# Patient Record
Sex: Male | Born: 1965
Health system: Southern US, Community
[De-identification: ages and names within clinical notes are randomized; demographics above are authoritative.]

## PROBLEM LIST (undated history)

## (undated) DIAGNOSIS — M1990 Unspecified osteoarthritis, unspecified site: Secondary | ICD-10-CM

## (undated) DIAGNOSIS — K5792 Diverticulitis of intestine, part unspecified, without perforation or abscess without bleeding: Secondary | ICD-10-CM

## (undated) DIAGNOSIS — J40 Bronchitis, not specified as acute or chronic: Secondary | ICD-10-CM

## (undated) DIAGNOSIS — K219 Gastro-esophageal reflux disease without esophagitis: Secondary | ICD-10-CM

## (undated) DIAGNOSIS — J189 Pneumonia, unspecified organism: Secondary | ICD-10-CM

## (undated) HISTORY — PX: APPENDECTOMY: SHX54

## (undated) HISTORY — DX: Unspecified osteoarthritis, unspecified site: M19.90

## (undated) HISTORY — PX: HERNIA REPAIR: SHX51

## (undated) HISTORY — DX: Gastro-esophageal reflux disease without esophagitis: K21.9

## (undated) HISTORY — DX: Diverticulitis of intestine, part unspecified, without perforation or abscess without bleeding: K57.92

## (undated) HISTORY — PX: POLYPECTOMY: SHX149

## (undated) HISTORY — PX: COLONOSCOPY: SHX174

---

## 2000-07-12 ENCOUNTER — Encounter (INDEPENDENT_AMBULATORY_CARE_PROVIDER_SITE_OTHER): Payer: Self-pay | Admitting: Specialist

## 2000-07-12 ENCOUNTER — Inpatient Hospital Stay (HOSPITAL_COMMUNITY): Admission: EM | Admit: 2000-07-12 | Discharge: 2000-07-14 | Payer: Self-pay | Admitting: *Deleted

## 2000-07-12 ENCOUNTER — Encounter: Payer: Self-pay | Admitting: Surgery

## 2000-07-25 ENCOUNTER — Emergency Department (HOSPITAL_COMMUNITY): Admission: EM | Admit: 2000-07-25 | Discharge: 2000-07-25 | Payer: Self-pay | Admitting: Internal Medicine

## 2007-02-02 ENCOUNTER — Emergency Department (HOSPITAL_COMMUNITY): Admission: EM | Admit: 2007-02-02 | Discharge: 2007-02-02 | Payer: Self-pay | Admitting: Emergency Medicine

## 2007-04-17 ENCOUNTER — Emergency Department (HOSPITAL_COMMUNITY): Admission: EM | Admit: 2007-04-17 | Discharge: 2007-04-17 | Payer: Self-pay | Admitting: Family Medicine

## 2007-05-27 ENCOUNTER — Emergency Department (HOSPITAL_COMMUNITY): Admission: EM | Admit: 2007-05-27 | Discharge: 2007-05-27 | Payer: Self-pay | Admitting: Family Medicine

## 2009-09-07 ENCOUNTER — Emergency Department (HOSPITAL_COMMUNITY): Admission: EM | Admit: 2009-09-07 | Discharge: 2009-09-07 | Payer: Self-pay | Admitting: Family Medicine

## 2010-07-04 ENCOUNTER — Emergency Department (HOSPITAL_COMMUNITY): Admission: EM | Admit: 2010-07-04 | Discharge: 2010-07-04 | Payer: Self-pay | Admitting: Emergency Medicine

## 2010-07-24 ENCOUNTER — Emergency Department (HOSPITAL_COMMUNITY)
Admission: EM | Admit: 2010-07-24 | Discharge: 2010-07-24 | Payer: Self-pay | Source: Home / Self Care | Admitting: Family Medicine

## 2010-12-04 LAB — CULTURE, ROUTINE-ABSCESS

## 2011-02-08 NOTE — Op Note (Signed)
[place]. [facility]  Patient:    Jon Vasquez, Jon Vasquez                      MRN: [id number] Proc. Date: 07/12/00 Adm. Date:  [id number] Attending:  Bonnetta Barry CC:         Virgina Norfolk, M.D.- [place], [facility]  Rene Kocher, M.D. [facility] EMERGENCY DEPARTMENT IN [facility] [place]   Operative Report  PREOPERATIVE  DIAGNOSIS:  Acute appendicitis.  POSTOPERATIVE DIAGNOSIS:  Acute appendicitis.  PROCEDURE:  Laparoscopic appendectomy.  SURGEON:  Velora Heckler, M.D.  ANESTHESIA:  General per Dr. Randa Evens.  ESTIMATED BLOOD LOSS:  Minimal.  PREPARATION:  Betadine.  COMPLICATIONS:  None.  INDICATIONS:  The patient is a 45 year old white male presents to the emergency department on referral from [facility] with right lower quadrant abdominal pain and fever.  The patient was evaluated by general surgery and CT scan with rectal contrast demonstrates findings consistent with acute appendicitis.  The patient was brought to the operating room for a laparoscopic appendectomy.  PROCEDURE:  The procedure was done in OR #15 at the [facility].  The patient was brought to the operating room and placed in a supine position on the operating room table.  Following administration of general anesthesia the patient was prepped and draped in the usual strict aseptic fashion.  After ascertaining an adequate level of anesthesia had been obtained, a supraumbilical incision was made with a #15 blade.  Dissection was carried down to the fascia.  The fascia was incised in the midline and the peritoneal cavity was entered cautiously.  An 0 Vicryl pursestring suture was placed in the fascia.  A Hasson cannula was then introduced under direct vision and secured with the pursestring suture.  The abdomen was insufflated with carbon dioxide.  The laparoscope was introduced under direct vision and the abdomen explored.  Operative ports were  placed in the right upper quadrant and left lower quadrant.  There is dense inflammatory change in the right lower quadrant.  The terminal ileum is adherent to the base of the cecum.  The terminal ileum and coxcomb are mobilized gently exposing the appendix.  The appendix is inflamed, indurated and is curled into nearly a complete circle. There is no obvious sign of rupture or abscess formation.  The appendix was mobilized from the lateral abdominal side wall.  However, due to the curling of the appendix, it is quite difficult to discern where the base of the appendix lies.  Therefore the appendiceal mesentery is divided with a load of the Endo GIA vascular stapler.  This allows for straightening of the appendix.  With gentle dissection the appendiceal mesentery is dissected out.  Regular endoscopic Ligaclips are used for hemostasis.  Mesentery is taken down to the appendix and the base of the appendix in its junction with the cecum that is positively identified.  This was transected with another load of vascular staples from the Endo GIA.  The appendix is completely transected, placed into an Endo catch bag bag.  It is withdrawn through the left lower quadrant port.   The right lower quadrant and pelvis are irrigated copiously with warm saline which is evacuated.  Good hemostasis is noted.  The ports were removed under direct vision and the pneumoperitoneum released.  An 0 Vicryl pursestring suture is tied securely.  All port sites are anesthetized with local anesthetic.  All three wounds are closed with interrupted  4-0 Vicryl subcuticular sutures.  The wounds are washed and dried with Benzoin and Steri-Strips are applied.  Sterile gauze dressings are applied.  The patient is awakened from anesthesia and brought to the recovery room in stable condition.  The patient tolerated the procedure well. DD:  07/12/00 TD:  07/13/00 Job: [id number]

## 2011-02-08 NOTE — Discharge Summary (Signed)
[place]. [facility]  Patient:    Jon Vasquez, Jon Vasquez                      MRN: [id number] Adm. Date:  [id number] Disc. Date: 07/14/00 Attending:  Bonnetta Barry CC:         Maricela Bo, M.D., [facility], [place], [place]  [place]. [place], M.D., Emergency Department, [facility]   Discharge Summary  REASON FOR ADMISSION:  Acute appendicitis.  BRIEF HISTORY:  The patient is a 45 year old white male who developed abdominal pain 48 hours prior to admission.  He was initially seen at [facility], treated with IV fluids and oral ciprofloxacin for presumed gastroenteritis.  The patient presented back to the emergency department with abdominal pain, fever, and chills.  He was transferred to [facility] for surgical evaluation and management.  HOSPITAL COURSE:  The patient was seen and evaluated in the emergency department.  He was taken to the operating room on the evening of July 12, 2000, and underwent laparoscopic appendectomy.  Postoperative course was straightforward.  He received 24 hours of intravenous antibiotics.  He was advanced from clear liquids to a regular diet.  He was prepared for discharge home on the second postoperative day.  DISCHARGE PLANNING:  The patient is discharged home today, July 14, 2000, in good condition, tolerating a regular diet, and ambulating independently. He will be seen back at my office at [facility] in 10 days for a wound check.  DISCHARGE MEDICATIONS:  Includes Vicodin as needed for pain.  FINAL DIAGNOSIS:  Acute appendicitis.  CONDITION UPON DISCHARGE:  Improved. DD:  07/14/00 TD:  07/14/00 Job: [id number]

## 2011-07-05 LAB — I-STAT 8, (EC8 V) (CONVERTED LAB)
Acid-Base Excess: 2
BUN: 17
Bicarbonate: 25.9 — ABNORMAL HIGH
Chloride: 103
Glucose, Bld: 101 — ABNORMAL HIGH
HCT: 45
Hemoglobin: 15.3
Operator id: 247071
Potassium: 3.7
Sodium: 137
TCO2: 27
pCO2, Ven: 38.9 — ABNORMAL LOW
pH, Ven: 7.432 — ABNORMAL HIGH

## 2011-07-05 LAB — CBC
HCT: 41.2
Hemoglobin: 14.2
MCHC: 34.4
RDW: 12.2

## 2011-07-05 LAB — POCT URINALYSIS DIP (DEVICE)
Glucose, UA: NEGATIVE
Hgb urine dipstick: NEGATIVE
Nitrite: NEGATIVE
Operator id: 247071
Protein, ur: 30 — AB
Specific Gravity, Urine: 1.02
Urobilinogen, UA: 1
pH: 7

## 2011-07-05 LAB — DIFFERENTIAL
Basophils Absolute: 0
Eosinophils Relative: 1
Lymphocytes Relative: 18
Monocytes Absolute: 0.8 — ABNORMAL HIGH

## 2011-07-05 LAB — RAPID STREP SCREEN (MED CTR MEBANE ONLY): Streptococcus, Group A Screen (Direct): NEGATIVE

## 2012-10-16 ENCOUNTER — Emergency Department (INDEPENDENT_AMBULATORY_CARE_PROVIDER_SITE_OTHER)
Admission: EM | Admit: 2012-10-16 | Discharge: 2012-10-16 | Disposition: A | Discharge status: Home / Self Care | Payer: BC Managed Care – PPO | Source: Home / Self Care | Attending: Family Medicine | Admitting: Family Medicine

## 2012-10-16 ENCOUNTER — Encounter (HOSPITAL_COMMUNITY): Payer: Self-pay | Admitting: *Deleted

## 2012-10-16 DIAGNOSIS — J111 Influenza due to unidentified influenza virus with other respiratory manifestations: Secondary | ICD-10-CM

## 2012-10-16 HISTORY — DX: Bronchitis, not specified as acute or chronic: J40

## 2012-10-16 HISTORY — DX: Pneumonia, unspecified organism: J18.9

## 2012-10-16 MED ORDER — OSELTAMIVIR PHOSPHATE 75 MG PO CAPS: 75.0000 mg | ORAL_CAPSULE | Freq: Two times a day (BID) | ORAL | Status: DC

## 2012-10-16 MED ORDER — BENZONATATE 100 MG PO CAPS: 100.0000 mg | ORAL_CAPSULE | Freq: Three times a day (TID) | ORAL | Status: DC

## 2012-10-16 MED ORDER — HYDROCODONE-ACETAMINOPHEN 7.5-325 MG/15ML PO SOLN: 15.0000 mL | Freq: Every evening | ORAL | Status: DC | PRN

## 2012-10-16 MED ORDER — FEXOFENADINE-PSEUDOEPHED ER 60-120 MG PO TB12: 1.0000 | ORAL_TABLET | Freq: Two times a day (BID) | ORAL | Status: DC

## 2012-10-16 MED ORDER — ONDANSETRON 4 MG PO TBDP: 4.0000 mg | ORAL_TABLET | Freq: Three times a day (TID) | ORAL | Status: DC | PRN

## 2012-10-16 MED ORDER — IBUPROFEN 600 MG PO TABS: 600.0000 mg | ORAL_TABLET | Freq: Three times a day (TID) | ORAL | Status: DC | PRN

## 2012-10-16 NOTE — ED Notes (Signed)
Vomited last night x 1.  Woke up this AM with nausea and vomited x 1.  No diarrhea.  C/o headache onset today.  No chills or fever.  Started coughing this afternoon.  Hx. Bronchitis/pneumonia.

## 2012-10-16 NOTE — ED Provider Notes (Signed)
History     CSN: [id number]  Arrival date & time 10/16/12  2725   First MD Initiated Contact with Patient 10/16/12 1843      Chief Complaint  Patient presents with  . Nausea    (Consider location/radiation/quality/duration/timing/severity/associated sxs/prior treatment) HPI Comments: 47 year old male with history of bronchitis and pneumonia years ago otherwise healthy. Comes complaining of nasal congestion and cough productive of clear sputum since yesterday. Symptoms associated with nausea and one episode of emesis last night. Today having headache. Denies high fever although has felt warm last night. Has been taking NyQuil and DayQuil with improvement of his symptoms. Patient reports that he's been exposed to individuals that have diagnosed with influenza. He's also concerned as he developed pneumonia in the past. Denies right course. Denies pleuritic type of chest pain or shortness of breath. Appetite is good. Energy level at baseline. No diarrhea or abdominal pain. No rash.   Past Medical History  Diagnosis Date  . Bronchitis   . Pneumonia     Past Surgical History  Procedure Date  . Appendectomy     Family History  Problem Relation Age of Onset  . Cancer Mother   . Diabetes Mother   . Heart disease Father   . Pulmonary fibrosis Father     History  Substance Use Topics  . Smoking status: Never Smoker   . Smokeless tobacco: Not on file  . Alcohol Use: No      Review of Systems  Constitutional: Negative for chills, diaphoresis, appetite change and fatigue.  HENT: Positive for congestion and rhinorrhea. Negative for sore throat, neck pain and neck stiffness.   Respiratory: Positive for cough. Negative for chest tightness, shortness of breath and wheezing.   Cardiovascular: Negative for chest pain, palpitations and leg swelling.  Gastrointestinal: Positive for nausea and vomiting. Negative for abdominal pain and diarrhea.  Musculoskeletal: Negative for myalgias and  arthralgias.  Skin: Negative for rash.  Neurological: Positive for headaches. Negative for dizziness.  All other systems reviewed and are negative.    Allergies  Review of patient's allergies indicates no known allergies.  Home Medications   Current Outpatient Rx  Name  Route  Sig  Dispense  Refill  . ACETAMINOPHEN 500 MG PO CHEW   Oral   Chew 1,000 mg by mouth every 6 (six) hours as needed.         . NYQUIL PO   Oral   Take by mouth.         . BENZONATATE 100 MG PO CAPS   Oral   Take 1 capsule (100 mg total) by mouth every 8 (eight) hours.   21 capsule   0   . FEXOFENADINE-PSEUDOEPHED ER 60-120 MG PO TB12   Oral   Take 1 tablet by mouth every 12 (twelve) hours.   30 tablet   0   . HYDROCODONE-ACETAMINOPHEN 7.5-325 MG/15ML PO SOLN   Oral   Take 15 mLs by mouth at bedtime as needed for pain or cough.   120 mL   0   . IBUPROFEN 600 MG PO TABS   Oral   Take 1 tablet (600 mg total) by mouth every 8 (eight) hours as needed for pain or fever.   20 tablet   0   . ONDANSETRON 4 MG PO TBDP   Oral   Take 1 tablet (4 mg total) by mouth every 8 (eight) hours as needed for nausea.   10 tablet   0   . OSELTAMIVIR  PHOSPHATE 75 MG PO CAPS   Oral   Take 1 capsule (75 mg total) by mouth every 12 (twelve) hours.   10 capsule   0     BP 124/78  Pulse 88  Temp 97 F (36.1 C) (Oral)  Resp 20  SpO2 98%  Physical Exam  Nursing note and vitals reviewed. Constitutional: He is oriented to person, place, and time. He appears well-developed and well-nourished. No distress.  HENT:  Head: Normocephalic and atraumatic.       Nasal Congestion with erythema and swelling of nasal turbinates, clear rhinorrhea. mild pharyngeal erythema, no exudates. No uvula deviation. No trismus. TM's with increased vascular markings and clear fluid behind bilaterally, no dullness bilaterally no swelling or bulging.   Eyes: Conjunctivae normal and EOM are normal. Pupils are equal, round,  and reactive to light. Right eye exhibits no discharge. Left eye exhibits no discharge. No scleral icterus.  Neck: Neck supple. No JVD present. No thyromegaly present.  Cardiovascular: Normal rate, regular rhythm and normal heart sounds.  Exam reveals no gallop and no friction rub.   No murmur heard. Pulmonary/Chest: Effort normal and breath sounds normal. No respiratory distress. He has no wheezes. He has no rales. He exhibits no tenderness.  Abdominal: Soft. There is no tenderness.  Lymphadenopathy:    He has no cervical adenopathy.  Neurological: He is alert and oriented to person, place, and time.  Skin: No rash noted. He is not diaphoretic.    ED Course  Procedures (including critical care time)  Labs Reviewed - No data to display No results found.   1. Influenza-like illness       MDM  Impress upper respiratory infection likely influenza since patient has positive contacts. Prescribed oseltamivir, ondansetron, ibuprofen, Allegra D., hydrocodone/acetaminophen and Tessalon Perles. Supportive care and red flags that should prompt his return to medical attention discussed with patient and provided in writing.        Sharin Grave, MD 10/16/12 2011

## 2015-01-26 ENCOUNTER — Encounter (HOSPITAL_COMMUNITY): Payer: Self-pay | Admitting: *Deleted

## 2015-01-26 ENCOUNTER — Emergency Department (INDEPENDENT_AMBULATORY_CARE_PROVIDER_SITE_OTHER): Payer: BLUE CROSS/BLUE SHIELD

## 2015-01-26 ENCOUNTER — Emergency Department (INDEPENDENT_AMBULATORY_CARE_PROVIDER_SITE_OTHER)
Admission: EM | Admit: 2015-01-26 | Discharge: 2015-01-26 | Disposition: A | Discharge status: Home / Self Care | Payer: BLUE CROSS/BLUE SHIELD | Source: Home / Self Care | Attending: Family Medicine | Admitting: Family Medicine

## 2015-01-26 DIAGNOSIS — J45901 Unspecified asthma with (acute) exacerbation: Secondary | ICD-10-CM

## 2015-01-26 MED ORDER — IPRATROPIUM BROMIDE 0.02 % IN SOLN
RESPIRATORY_TRACT | Status: AC
  Filled 2015-01-26: qty 2.5

## 2015-01-26 MED ORDER — METHYLPREDNISOLONE 4 MG PO TBPK: ORAL_TABLET | ORAL | Status: DC

## 2015-01-26 MED ORDER — ALBUTEROL SULFATE (2.5 MG/3ML) 0.083% IN NEBU
5.0000 mg | INHALATION_SOLUTION | Freq: Once | RESPIRATORY_TRACT | Status: AC
  Administered 2015-01-26: 5 mg via RESPIRATORY_TRACT

## 2015-01-26 MED ORDER — METHYLPREDNISOLONE SODIUM SUCC 125 MG IJ SOLR
INTRAMUSCULAR | Status: AC
  Filled 2015-01-26: qty 2

## 2015-01-26 MED ORDER — METHYLPREDNISOLONE SODIUM SUCC 125 MG IJ SOLR
125.0000 mg | Freq: Once | INTRAMUSCULAR | Status: AC
  Administered 2015-01-26: 125 mg via INTRAMUSCULAR

## 2015-01-26 MED ORDER — IPRATROPIUM BROMIDE 0.02 % IN SOLN
0.5000 mg | Freq: Once | RESPIRATORY_TRACT | Status: AC
  Administered 2015-01-26: 0.5 mg via RESPIRATORY_TRACT

## 2015-01-26 MED ORDER — ALBUTEROL SULFATE (2.5 MG/3ML) 0.083% IN NEBU
INHALATION_SOLUTION | RESPIRATORY_TRACT | Status: AC
  Filled 2015-01-26: qty 6

## 2015-01-26 NOTE — Discharge Instructions (Signed)
Take all of medicine, drink lots of fluids, see your doctor if further problems °

## 2015-01-26 NOTE — ED Provider Notes (Signed)
CSN: [id number]     Arrival date & time 01/26/15  1341 History   First MD Initiated Contact with Patient 01/26/15 1414     Chief Complaint  Patient presents with  . URI   (Consider location/radiation/quality/duration/timing/severity/associated sxs/prior Treatment) Patient is a 49 y.o. male presenting with URI. The history is provided by the patient.  URI Presenting symptoms: congestion, cough, fever and rhinorrhea   Severity:  Moderate Onset quality:  Gradual Duration:  2 days Chronicity:  New Relieved by:  None tried Worsened by:  Nothing tried Ineffective treatments:  None tried Associated symptoms: wheezing   Risk factors: no recent illness     Past Medical History  Diagnosis Date  . Bronchitis   . Pneumonia    Past Surgical History  Procedure Laterality Date  . Appendectomy     Family History  Problem Relation Age of Onset  . Cancer Mother   . Diabetes Mother   . Heart disease Father   . Pulmonary fibrosis Father    History  Substance Use Topics  . Smoking status: Never Smoker   . Smokeless tobacco: Not on file  . Alcohol Use: No    Review of Systems  Constitutional: Positive for fever.  HENT: Positive for congestion, postnasal drip and rhinorrhea.   Respiratory: Positive for cough and wheezing.   Cardiovascular: Negative.   Gastrointestinal: Negative.     Allergies  Review of patient's allergies indicates no known allergies.  Home Medications   Prior to Admission medications   Medication Sig Start Date End Date Taking? Authorizing Provider  acetaminophen (TYLENOL) 500 MG chewable tablet Chew 1,000 mg by mouth every 6 (six) hours as needed.    Historical Provider, MD  benzonatate (TESSALON) 100 MG capsule Take 1 capsule (100 mg total) by mouth every 8 (eight) hours. 10/16/12   Adlih Moreno-Coll, MD  fexofenadine-pseudoephedrine (ALLEGRA-D) 60-120 MG per tablet Take 1 tablet by mouth every 12 (twelve) hours. 10/16/12   Adlih Moreno-Coll, MD   HYDROcodone-acetaminophen (HYCET) 7.5-325 mg/15 ml solution Take 15 mLs by mouth at bedtime as needed for pain or cough. 10/16/12   Adlih Moreno-Coll, MD  ibuprofen (ADVIL,MOTRIN) 600 MG tablet Take 1 tablet (600 mg total) by mouth every 8 (eight) hours as needed for pain or fever. 10/16/12   Adlih Moreno-Coll, MD  methylPREDNISolone (MEDROL DOSEPAK) 4 MG TBPK tablet follow package directions ,start on fri , take until finished 01/26/15   Billy Fischer, MD  ondansetron (ZOFRAN-ODT) 4 MG disintegrating tablet Take 1 tablet (4 mg total) by mouth every 8 (eight) hours as needed for nausea. 10/16/12   Adlih Moreno-Coll, MD  oseltamivir (TAMIFLU) 75 MG capsule Take 1 capsule (75 mg total) by mouth every 12 (twelve) hours. 10/16/12   Adlih Moreno-Coll, MD  Pseudoeph-Doxylamine-DM-APAP (NYQUIL PO) Take by mouth.    Historical Provider, MD   BP 120/77 mmHg  Pulse 89  Temp(Src) 99.1 F (37.3 C) (Oral)  Resp 16  SpO2 100% Physical Exam  Constitutional: He is oriented to person, place, and time. He appears well-developed and well-nourished. No distress.  HENT:  Head: Normocephalic.  Right Ear: External ear normal.  Left Ear: External ear normal.  Mouth/Throat: Oropharynx is clear and moist.  Eyes: Pupils are equal, round, and reactive to light.  Neck: Normal range of motion. Neck supple.  Cardiovascular: Normal rate, regular rhythm, normal heart sounds and intact distal pulses.   Pulmonary/Chest: Effort normal. He has wheezes. He has rhonchi. He has no rales.  Lymphadenopathy:  He has no cervical adenopathy.  Neurological: He is alert and oriented to person, place, and time.  Skin: Skin is warm and dry.  Nursing note and vitals reviewed.   ED Course  Procedures (including critical care time) Labs Review Labs Reviewed - No data to display  Imaging Review Dg Chest 2 View  01/26/2015   CLINICAL DATA:  Sick for 2 days with cough, chest congestion, headache, and back soreness, history of  bronchitis and pneumonia  EXAM: CHEST  2 VIEW  COMPARISON:  01/09/2012  FINDINGS: Normal heart size, mediastinal contours, and pulmonary vascularity.  Lungs clear.  No pneumothorax.  Bones unremarkable.  IMPRESSION: Normal exam.   Electronically Signed   By: Lavonia Dana M.D.   On: 01/26/2015 14:47     MDM   1. Asthmatic bronchitis with acute exacerbation    Sx improved, lungs clear after neb at d/c.   Billy Fischer, MD 01/26/15 [phone]

## 2015-01-26 NOTE — ED Notes (Signed)
Pt     Reports    Symptoms        Of   Cough   Congested     Sinus  Drainage      With  Symptoms  X  2  Days          Pt  Reports        Has  Some  Chills   As   Well           Pt  Ambulated to  Room   With a  Steady  Fluid  gait

## 2016-09-11 ENCOUNTER — Encounter (HOSPITAL_COMMUNITY): Payer: Self-pay | Admitting: Emergency Medicine

## 2016-09-11 ENCOUNTER — Emergency Department (HOSPITAL_COMMUNITY)
Admission: EM | Admit: 2016-09-11 | Discharge: 2016-09-11 | Disposition: A | Discharge status: Home / Self Care | Payer: BLUE CROSS/BLUE SHIELD | Attending: Emergency Medicine | Admitting: Emergency Medicine

## 2016-09-11 DIAGNOSIS — M62838 Other muscle spasm: Secondary | ICD-10-CM | POA: Diagnosis not present

## 2016-09-11 DIAGNOSIS — M542 Cervicalgia: Secondary | ICD-10-CM | POA: Diagnosis present

## 2016-09-11 DIAGNOSIS — Z79899 Other long term (current) drug therapy: Secondary | ICD-10-CM | POA: Insufficient documentation

## 2016-09-11 LAB — CBC WITH DIFFERENTIAL/PLATELET
BASOS ABS: 0 10*3/uL (ref 0.0–0.1)
Basophils Relative: 0 %
EOS ABS: 0.1 10*3/uL (ref 0.0–0.7)
Eosinophils Relative: 1 %
HCT: 45.2 % (ref 39.0–52.0)
HEMOGLOBIN: 15.2 g/dL (ref 13.0–17.0)
LYMPHS ABS: 3.5 10*3/uL (ref 0.7–4.0)
LYMPHS PCT: 39 %
MCH: 29.3 pg (ref 26.0–34.0)
MCHC: 33.6 g/dL (ref 30.0–36.0)
MCV: 87.3 fL (ref 78.0–100.0)
Monocytes Absolute: 0.6 10*3/uL (ref 0.1–1.0)
Monocytes Relative: 7 %
NEUTROS PCT: 53 %
Neutro Abs: 4.8 10*3/uL (ref 1.7–7.7)
PLATELETS: 199 10*3/uL (ref 150–400)
RBC: 5.18 MIL/uL (ref 4.22–5.81)
RDW: 12.3 % (ref 11.5–15.5)
WBC: 9 10*3/uL (ref 4.0–10.5)

## 2016-09-11 LAB — COMPREHENSIVE METABOLIC PANEL
ALK PHOS: 58 U/L (ref 38–126)
ALT: 39 U/L (ref 17–63)
AST: 25 U/L (ref 15–41)
Albumin: 3.6 g/dL (ref 3.5–5.0)
Anion gap: 10 (ref 5–15)
BUN: 15 mg/dL (ref 6–20)
CALCIUM: 9.7 mg/dL (ref 8.9–10.3)
CHLORIDE: 101 mmol/L (ref 101–111)
CO2: 26 mmol/L (ref 22–32)
CREATININE: 1.15 mg/dL (ref 0.61–1.24)
GFR calc non Af Amer: >60 mL/min (ref >60)
GLUCOSE: 125 mg/dL — AB (ref 65–99)
Potassium: 3.7 mmol/L (ref 3.5–5.1)
SODIUM: 137 mmol/L (ref 135–145)
Total Bilirubin: 0.6 mg/dL (ref 0.3–1.2)
Total Protein: 6.4 g/dL — ABNORMAL LOW (ref 6.5–8.1)

## 2016-09-11 LAB — I-STAT TROPONIN, ED: Troponin i, poc: 0.01 ng/mL (ref 0.00–0.08)

## 2016-09-11 MED ORDER — DIAZEPAM 5 MG PO TABS
10.0000 mg | ORAL_TABLET | Freq: Once | ORAL | Status: AC
  Administered 2016-09-11: 10 mg via ORAL
  Filled 2016-09-11: qty 2

## 2016-09-11 MED ORDER — DEXAMETHASONE SODIUM PHOSPHATE 10 MG/ML IJ SOLN
10.0000 mg | Freq: Once | INTRAMUSCULAR | Status: AC
  Administered 2016-09-11: 10 mg via INTRAMUSCULAR
  Filled 2016-09-11: qty 1

## 2016-09-11 MED ORDER — DIAZEPAM 5 MG PO TABS: 5.0000 mg | ORAL_TABLET | Freq: Two times a day (BID) | ORAL | 0 refills | Status: DC

## 2016-09-11 NOTE — ED Provider Notes (Signed)
[place] DEPT Provider Note   CSN: [id number] Arrival date & time: 09/11/16  0036     History   Chief Complaint Chief Complaint  Patient presents with  . Neck Pain    Arm Pain     HPI Jon Vasquez is a 50 y.o. male.  HPI  50 y.o. malepresents to the Emergency Department today complaining of left sided neck pain. States pain is along trapezius. Notes not trauma to area. Seen by chiropractor and UC and given muscle relaxants with minimal relief. No N/V. No CP/SOB/ABD pain. No diaphoresis. Rates pain 10/10. No numbness/tingling. No decrease in grip strength. No fevers. No other symptoms noted.    Past Medical History:  Diagnosis Date  . Bronchitis   . Pneumonia     There are no active problems to display for this patient.   Past Surgical History:  Procedure Laterality Date  . APPENDECTOMY         Home Medications    Prior to Admission medications   Medication Sig Start Date End Date Taking? Authorizing Provider  acetaminophen (TYLENOL) 500 MG chewable tablet Chew 1,000 mg by mouth every 6 (six) hours as needed.    Historical Provider, MD  benzonatate (TESSALON) 100 MG capsule Take 1 capsule (100 mg total) by mouth every 8 (eight) hours. 10/16/12   Adlih Moreno-Coll, MD  diazepam (VALIUM) 5 MG tablet Take 1 tablet (5 mg total) by mouth 2 (two) times daily. 09/11/16   Shary Decamp, PA-C  fexofenadine-pseudoephedrine (ALLEGRA-D) 60-120 MG per tablet Take 1 tablet by mouth every 12 (twelve) hours. 10/16/12   Adlih Moreno-Coll, MD  HYDROcodone-acetaminophen (HYCET) 7.5-325 mg/15 ml solution Take 15 mLs by mouth at bedtime as needed for pain or cough. 10/16/12   Adlih Moreno-Coll, MD  ibuprofen (ADVIL,MOTRIN) 600 MG tablet Take 1 tablet (600 mg total) by mouth every 8 (eight) hours as needed for pain or fever. 10/16/12   Adlih Moreno-Coll, MD  methylPREDNISolone (MEDROL DOSEPAK) 4 MG TBPK tablet follow package directions ,start on fri , take until finished 01/26/15   Billy Fischer, MD  ondansetron (ZOFRAN-ODT) 4 MG disintegrating tablet Take 1 tablet (4 mg total) by mouth every 8 (eight) hours as needed for nausea. 10/16/12   Adlih Moreno-Coll, MD  oseltamivir (TAMIFLU) 75 MG capsule Take 1 capsule (75 mg total) by mouth every 12 (twelve) hours. 10/16/12   Adlih Moreno-Coll, MD  Pseudoeph-Doxylamine-DM-APAP (NYQUIL PO) Take by mouth.    Historical Provider, MD    Family History Family History  Problem Relation Age of Onset  . Cancer Mother   . Diabetes Mother   . Heart disease Father   . Pulmonary fibrosis Father     Social History Social History  Substance Use Topics  . Smoking status: Never Smoker  . Smokeless tobacco: Never Used  . Alcohol use No     Allergies   Patient has no known allergies.   Review of Systems Review of Systems ROS reviewed and all are negative for acute change except as noted in the HPI.  Physical Exam Updated Vital Signs BP 138/74 (BP Location: Right Arm)   Pulse 77   Temp 98.4 F (36.9 C) (Oral)   Resp 16   Ht 6\' 1"  (1.854 m)   Wt 115.2 kg   SpO2 99%   BMI 33.51 kg/m   Physical Exam  Constitutional: He is oriented to person, place, and time. Vital signs are normal. He appears well-developed and well-nourished.  HENT:  Head:  Normocephalic.  Right Ear: Hearing normal.  Left Ear: Hearing normal.  Eyes: Conjunctivae and EOM are normal. Pupils are equal, round, and reactive to light.  Neck: Normal range of motion. Neck supple.  No midline spinous process tenderness. Pain is reproducible along left trapezius musculature.   Cardiovascular: Normal rate, regular rhythm, normal heart sounds and intact distal pulses.   Pulmonary/Chest: Effort normal and breath sounds normal.  Musculoskeletal: Normal range of motion.  Neurological: He is alert and oriented to person, place, and time. He has normal strength. No sensory deficit.  BUE with equal grip strengths. No neurosensory deficits appreciated   Skin: Skin is warm  and dry.  Psychiatric: He has a normal mood and affect. His speech is normal and behavior is normal. Thought content normal.   ED Treatments / Results  Labs (all labs ordered are listed, but only abnormal results are displayed) Labs Reviewed  COMPREHENSIVE METABOLIC PANEL - Abnormal; Notable for the following:       Result Value   Glucose, Bld 125 (*)    Total Protein 6.4 (*)    All other components within normal limits  CBC WITH DIFFERENTIAL/PLATELET  I-STAT TROPOININ, ED    EKG  EKG Interpretation  Date/Time:  Wednesday September 11 2016 00:48:49 EST Ventricular Rate:  81 PR Interval:  160 QRS Duration: 94 QT Interval:  376 QTC Calculation: 436 R Axis:   -9 Text Interpretation:  Sinus rhythm with Blocked Premature atrial complexes Otherwise normal ECG No old tracing to compare Confirmed by [facility]  MD, DAVID ([id number]) on 09/11/2016 1:43:49 AM       Radiology No results found.  Procedures Procedures (including critical care time)  Medications Ordered in ED Medications  dexamethasone (DECADRON) injection 10 mg (not administered)  diazepam (VALIUM) tablet 10 mg (not administered)     Initial Impression / Assessment and Plan / ED Course  I have reviewed the triage vital signs and the nursing notes.  Pertinent labs & imaging results that were available during my care of the patient were reviewed by me and considered in my medical decision making (see chart for details).  Clinical Course    Final Clinical Impressions(s) / ED Diagnoses  {I have reviewed and evaluated the relevant laboratory values.  {I have interpreted the relevant EKG. {I have reviewed the relevant previous healthcare records.  {I obtained HPI from historian.   ED Course:  Assessment: Pt is a 50yM who presents with left trapezius pain x 2-3 days. Attempted muscle relaxants from UC and Chiropractor. No CP/SOB/ no numbness/tingling. No fevers. On exam, pt in NAD. Nontoxic/nonseptic appearing. VSS.  Afebrile. Lungs CTA. Heart RRR. No midline spinous process tenderness. TTP along trapezius muscle suspect muscle spasm.Labs unremarkable. EKG unremarkable. Given valium and decadron in ED. Plan is to DC home and follow up to PCP tomorrow at scheduled appointment. No acute pathology identified. No neuro deficits noted.. At time of discharge, Patient is in no acute distress. Vital Signs are stable. Patient is able to ambulate. Patient able to tolerate PO.   Disposition/Plan:  DC Home Additional Verbal discharge instructions given and discussed with patient.  Pt Instructed to f/u with PCP in the next week for evaluation and treatment of symptoms. Return precautions given Pt acknowledges and agrees with plan  Supervising Physician Merryl Hacker, MD  Final diagnoses:  Trapezius muscle spasm    New Prescriptions New Prescriptions   DIAZEPAM (VALIUM) 5 MG TABLET    Take 1 tablet (5 mg total) by  mouth 2 (two) times daily.     Shary Decamp, PA-C 09/11/16 0302    Merryl Hacker, MD 09/11/16 [phone]

## 2016-09-11 NOTE — Discharge Instructions (Signed)
Please read and follow all provided instructions.  Your diagnoses today include:  1. Trapezius muscle spasm     Tests performed today include: Vital signs. See below for your results today.   Medications prescribed:  Take as prescribed   Home care instructions:  Follow any educational materials contained in this packet.  Follow-up instructions: Please follow-up with your primary care provider for further evaluation of symptoms and treatment   Return instructions:  Please return to the Emergency Department if you do not get better, if you get worse, or new symptoms OR  - Fever (temperature greater than 101.41F)  - Bleeding that does not stop with holding pressure to the area    -Severe pain (please note that you may be more sore the day after your accident)  - Chest Pain  - Difficulty breathing  - Severe nausea or vomiting  - Inability to tolerate food and liquids  - Passing out  - Skin becoming red around your wounds  - Change in mental status (confusion or lethargy)  - New numbness or weakness    Please return if you have any other emergent concerns.  Additional Information:  Your vital signs today were: BP 138/74 (BP Location: Right Arm)    Pulse 77    Temp 98.4 F (36.9 C) (Oral)    Resp 16    Ht 6\' 1"  (1.854 m)    Wt 115.2 kg    SpO2 99%    BMI 33.51 kg/m  If your blood pressure (BP) was elevated above 135/85 this visit, please have this repeated by your doctor within one month. --------------

## 2016-09-11 NOTE — ED Triage Notes (Signed)
Patient reports persistent left lateral neck pain radiating down to left shoulder and left arm onset Thursday last week , seen at an urgent care Friday prescribed with Norflex and Mobic with no relief.

## 2016-09-11 NOTE — ED Notes (Signed)
The pt is c/o lt neck and arm pain for several days  He was seen at [facility] yesterday and worked up for cardiac but that was ruled out.  They also did a xray of his rt shoulder.   C/o pain

## 2017-02-20 ENCOUNTER — Encounter (HOSPITAL_COMMUNITY): Payer: Self-pay | Admitting: *Deleted

## 2017-02-20 ENCOUNTER — Ambulatory Visit (HOSPITAL_COMMUNITY)
Admission: EM | Admit: 2017-02-20 | Discharge: 2017-02-20 | Disposition: A | Discharge status: Home / Self Care | Payer: BLUE CROSS/BLUE SHIELD | Attending: Internal Medicine | Admitting: Internal Medicine

## 2017-02-20 DIAGNOSIS — R6883 Chills (without fever): Secondary | ICD-10-CM | POA: Diagnosis not present

## 2017-02-20 DIAGNOSIS — J020 Streptococcal pharyngitis: Secondary | ICD-10-CM

## 2017-02-20 DIAGNOSIS — R21 Rash and other nonspecific skin eruption: Secondary | ICD-10-CM | POA: Diagnosis not present

## 2017-02-20 LAB — POCT RAPID STREP A: Streptococcus, Group A Screen (Direct): POSITIVE — AB

## 2017-02-20 MED ORDER — AMOXICILLIN 500 MG PO CAPS: 1000.0000 mg | ORAL_CAPSULE | Freq: Two times a day (BID) | ORAL | 0 refills | Status: AC

## 2017-02-20 MED ORDER — MAGIC MOUTHWASH W/LIDOCAINE: 5.0000 mL | Freq: Three times a day (TID) | ORAL | 0 refills | Status: DC | PRN

## 2017-02-20 MED ORDER — ONDANSETRON 4 MG PO TBDP: 4.0000 mg | ORAL_TABLET | Freq: Three times a day (TID) | ORAL | 0 refills | Status: DC | PRN

## 2017-02-20 MED ORDER — DEXAMETHASONE SODIUM PHOSPHATE 10 MG/ML IJ SOLN
10.0000 mg | Freq: Once | INTRAMUSCULAR | Status: AC
  Administered 2017-02-20: 10 mg via INTRAMUSCULAR

## 2017-02-20 MED ORDER — DEXAMETHASONE SODIUM PHOSPHATE 10 MG/ML IJ SOLN
INTRAMUSCULAR | Status: AC
  Filled 2017-02-20: qty 1

## 2017-02-20 NOTE — ED Triage Notes (Signed)
Patient states that starting yesterday he has had sore throat, chills, and nausea. Today bilateral rash developed on lower legs.

## 2017-02-20 NOTE — Discharge Instructions (Signed)
You tested positive for strep throat I'm starting on amoxicillin, take 2 tablets twice a day for 10 days.For Nausea, I have prescribed Zofran, take 1 tablet under the tongue every 8 hours as needed. Also given Magic mouthwash for pain, swish and swallow up to 3 times a day. If your symptoms persist past one week, follow-up with your primary care provider or return to clinic at anytime.

## 2017-02-20 NOTE — ED Provider Notes (Signed)
CSN: [id number]     Arrival date & time 02/20/17  1959 History   First MD Initiated Contact with Patient 02/20/17 2028     Chief Complaint  Patient presents with  . Sore Throat  . Chills  . Rash   (Consider location/radiation/quality/duration/timing/severity/associated sxs/prior Treatment) 51 year old male presents to clinic with a 48 hour history of sore throat. States this is one of the worst sore throat he's ever had, along with swelling in his throat, and pain with swallowing. He denies any fever, denies cough, has had some nausea, and vomiting, along with some stomach discomfort. He did recently return from a cruise to [place], and [place]. Denies any other pertinent history, or pertinent symptoms.   The history is provided by the patient.    Past Medical History:  Diagnosis Date  . Bronchitis   . Pneumonia    Past Surgical History:  Procedure Laterality Date  . APPENDECTOMY     Family History  Problem Relation Age of Onset  . Cancer Mother   . Diabetes Mother   . Heart disease Father   . Pulmonary fibrosis Father    Social History  Substance Use Topics  . Smoking status: Never Smoker  . Smokeless tobacco: Never Used  . Alcohol use No    Review of Systems  Constitutional: Positive for appetite change, chills and fever.  HENT: Positive for sore throat and trouble swallowing. Negative for congestion, rhinorrhea, sinus pain and sinus pressure.   Eyes: Negative.   Respiratory: Negative.   Cardiovascular: Negative.   Gastrointestinal: Positive for abdominal pain, nausea and vomiting. Negative for diarrhea.  Musculoskeletal: Negative.   Skin: Negative.   Neurological: Negative.   Hematological: Positive for adenopathy.    Allergies  Patient has no known allergies.  Home Medications   Prior to Admission medications   Medication Sig Start Date End Date Taking? Authorizing Provider  acetaminophen (TYLENOL) 500 MG chewable tablet Chew 1,000 mg by mouth every 6  (six) hours as needed.    [provider]  amoxicillin (AMOXIL) 500 MG capsule Take 2 capsules (1,000 mg total) by mouth 2 (two) times daily. 02/20/17 03/02/17  Barnet Glasgow, NP  diazepam (VALIUM) 5 MG tablet Take 1 tablet (5 mg total) by mouth 2 (two) times daily. 09/11/16   Shary Decamp, PA-C  fexofenadine-pseudoephedrine (ALLEGRA-D) 60-120 MG per tablet Take 1 tablet by mouth every 12 (twelve) hours. 10/16/12   Moreno-Coll, Adlih, MD  HYDROcodone-acetaminophen (HYCET) 7.5-325 mg/15 ml solution Take 15 mLs by mouth at bedtime as needed for pain or cough. 10/16/12   Moreno-Coll, Adlih, MD  ibuprofen (ADVIL,MOTRIN) 600 MG tablet Take 1 tablet (600 mg total) by mouth every 8 (eight) hours as needed for pain or fever. 10/16/12   Moreno-Coll, Adlih, MD  magic mouthwash w/lidocaine SOLN Take 5 mLs by mouth 3 (three) times daily as needed for mouth pain. 02/20/17   Barnet Glasgow, NP  methylPREDNISolone (MEDROL DOSEPAK) 4 MG TBPK tablet follow package directions ,start on fri , take until finished 01/26/15   Billy Fischer, MD  ondansetron (ZOFRAN ODT) 4 MG disintegrating tablet Take 1 tablet (4 mg total) by mouth every 8 (eight) hours as needed for nausea or vomiting. 02/20/17   Barnet Glasgow, NP  oseltamivir (TAMIFLU) 75 MG capsule Take 1 capsule (75 mg total) by mouth every 12 (twelve) hours. 10/16/12   Moreno-Coll, Adlih, MD  Pseudoeph-Doxylamine-DM-APAP (NYQUIL PO) Take by mouth.    [provider]   Meds Ordered and Administered  this Visit   Medications  dexamethasone (DECADRON) injection 10 mg (10 mg Intramuscular Given 02/20/17 2056)    BP 128/72 (BP Location: Left Arm)   Pulse 92   Temp 98.3 F (36.8 C) (Oral)   Resp 18   SpO2 100%  No data found.   Physical Exam  Constitutional: He is oriented to person, place, and time. He appears well-developed and well-nourished. No distress.  HENT:  Head: Normocephalic.  Right Ear: External ear normal.  Left Ear: External  ear normal.  Nose: Nose normal.  Mouth/Throat: Uvula is midline. Posterior oropharyngeal edema and posterior oropharyngeal erythema present. Tonsils are 3+ on the right. Tonsils are 3+ on the left. No tonsillar exudate.  Eyes: Conjunctivae are normal.  Neck: Normal range of motion.  Cardiovascular: Normal rate and regular rhythm.   Pulmonary/Chest: Effort normal and breath sounds normal.  Abdominal: Soft. Bowel sounds are normal.  Lymphadenopathy:    He has cervical adenopathy.  Neurological: He is alert and oriented to person, place, and time.  Skin: Skin is warm and dry. Capillary refill takes less than 2 seconds. He is not diaphoretic.  Psychiatric: He has a normal mood and affect. His behavior is normal.  Nursing note and vitals reviewed.   Urgent Care Course     Procedures (including critical care time)  Labs Review Labs Reviewed  POCT RAPID STREP A - Abnormal; Notable for the following:       Result Value   Streptococcus, Group A Screen (Direct) POSITIVE (*)    All other components within normal limits    Imaging Review No results found.   MDM   1. Strep pharyngitis    Positive for strep pharyngitis, given injection dexamethasone in clinic, discharged home with prescription for amoxicillin, Zofran, and Magic mouthwash. Follow up with his primary care provider or return to clinic as needed.     Barnet Glasgow, NP 02/20/17 2103

## 2017-11-03 ENCOUNTER — Ambulatory Visit (HOSPITAL_COMMUNITY)
Admission: EM | Admit: 2017-11-03 | Discharge: 2017-11-03 | Disposition: A | Discharge status: Home / Self Care | Payer: BLUE CROSS/BLUE SHIELD

## 2017-11-03 ENCOUNTER — Encounter (HOSPITAL_COMMUNITY): Payer: Self-pay | Admitting: Emergency Medicine

## 2017-11-03 DIAGNOSIS — R059 Cough, unspecified: Secondary | ICD-10-CM

## 2017-11-03 DIAGNOSIS — R05 Cough: Secondary | ICD-10-CM | POA: Diagnosis not present

## 2017-11-03 DIAGNOSIS — J209 Acute bronchitis, unspecified: Secondary | ICD-10-CM

## 2017-11-03 NOTE — ED Provider Notes (Signed)
  MRN: [id number] DOB: 17-Jan-1966  Subjective:   Jon Vasquez is a 52 y.o. male presenting for 1 month history of persistent productive cough that elicits chest pain, chest congestion now having blood in his sputum. Initially had shob, wheezing and has had mild improvement with steroid course. Denies sinus pain, throat pain, n/v, abdominal pain. Denies smoking cigarettes. He did see a provider at a different urgent care but only filled the script for prednisone and cough syrup. He was given script for azithromycin but has not taken this.  Jon Vasquez has No Known Allergies.  Jon Vasquez  has a past medical history of Bronchitis and Pneumonia. Also  has a past surgical history that includes Appendectomy.  Objective:   Vitals: BP 134/79   Pulse 86   Temp 98.3 F (36.8 C) (Oral)   Resp 16   Ht 6\' 1"  (1.854 m)   Wt 254 lb (115.2 kg)   SpO2 99%   BMI 33.51 kg/m   Physical Exam  Constitutional: He is oriented to person, place, and time. He appears well-developed and well-nourished.  HENT:  Mouth/Throat: Oropharynx is clear and moist.  Eyes: No scleral icterus.  Cardiovascular: Normal rate, regular rhythm and intact distal pulses. Exam reveals no gallop and no friction rub.  No murmur heard. Pulmonary/Chest: No respiratory distress. He has no wheezes. He has no rales.  Rhonchi throughout with mild wheezing.  Neurological: He is alert and oriented to person, place, and time.  Skin: Skin is warm and dry.   Assessment and Plan :   Acute bronchitis, unspecified organism  Cough  Start azithromycin as prescribed previously. Use cough suppression medications. Return-to-clinic precautions discussed, patient verbalized understanding.     Jaynee Eagles, [place] 11/03/17 1717

## 2017-11-03 NOTE — ED Triage Notes (Signed)
PT reports he had pneumonia 1 month ago. PT was treated for bronchitis one week ago and it has not improved with prednisone. PT reports he is now coughing up some blood.

## 2017-11-03 NOTE — Discharge Instructions (Signed)
Hydrate well with at least 2 liters (1 gallon) of water daily. For sore throat try using a honey-based tea. Use 3 teaspoons of honey with juice squeezed from half lemon. Place shaved pieces of ginger into 1/2-1 cup of water and warm over stove top. Then mix the ingredients and repeat every 4 hours as needed.   Take azithromycin 250mg  tablets. Start with 2 tablets today, then 1 daily thereafter. Schedule your albuterol inhaler 2-3 times daily for wheezing, use cough syrup at night only.

## 2018-02-19 DIAGNOSIS — S61411A Laceration without foreign body of right hand, initial encounter: Secondary | ICD-10-CM | POA: Diagnosis not present

## 2018-02-19 DIAGNOSIS — W268XXA Contact with other sharp object(s), not elsewhere classified, initial encounter: Secondary | ICD-10-CM | POA: Diagnosis not present

## 2018-05-21 DIAGNOSIS — J019 Acute sinusitis, unspecified: Secondary | ICD-10-CM | POA: Diagnosis not present

## 2018-05-21 DIAGNOSIS — J209 Acute bronchitis, unspecified: Secondary | ICD-10-CM | POA: Diagnosis not present

## 2018-07-30 DIAGNOSIS — J209 Acute bronchitis, unspecified: Secondary | ICD-10-CM | POA: Diagnosis not present

## 2018-09-15 ENCOUNTER — Emergency Department (HOSPITAL_COMMUNITY)
Admission: EM | Admit: 2018-09-15 | Discharge: 2018-09-15 | Disposition: A | Discharge status: Home / Self Care | Payer: BLUE CROSS/BLUE SHIELD | Attending: Emergency Medicine | Admitting: Emergency Medicine

## 2018-09-15 ENCOUNTER — Emergency Department (HOSPITAL_COMMUNITY): Payer: BLUE CROSS/BLUE SHIELD

## 2018-09-15 ENCOUNTER — Other Ambulatory Visit: Payer: Self-pay

## 2018-09-15 DIAGNOSIS — Z79899 Other long term (current) drug therapy: Secondary | ICD-10-CM | POA: Insufficient documentation

## 2018-09-15 DIAGNOSIS — K4020 Bilateral inguinal hernia, without obstruction or gangrene, not specified as recurrent: Secondary | ICD-10-CM | POA: Diagnosis not present

## 2018-09-15 DIAGNOSIS — R197 Diarrhea, unspecified: Secondary | ICD-10-CM

## 2018-09-15 DIAGNOSIS — R1010 Upper abdominal pain, unspecified: Secondary | ICD-10-CM | POA: Diagnosis not present

## 2018-09-15 DIAGNOSIS — K5730 Diverticulosis of large intestine without perforation or abscess without bleeding: Secondary | ICD-10-CM | POA: Diagnosis not present

## 2018-09-15 DIAGNOSIS — R55 Syncope and collapse: Secondary | ICD-10-CM

## 2018-09-15 DIAGNOSIS — R1110 Vomiting, unspecified: Secondary | ICD-10-CM | POA: Diagnosis not present

## 2018-09-15 DIAGNOSIS — R112 Nausea with vomiting, unspecified: Secondary | ICD-10-CM

## 2018-09-15 LAB — CBC WITH DIFFERENTIAL/PLATELET
Abs Immature Granulocytes: 0.02 10*3/uL (ref 0.00–0.07)
Basophils Absolute: 0 10*3/uL (ref 0.0–0.1)
Basophils Relative: 0 %
Eosinophils Absolute: 0 10*3/uL (ref 0.0–0.5)
Eosinophils Relative: 1 %
HCT: 46.2 % (ref 39.0–52.0)
Hemoglobin: 15.1 g/dL (ref 13.0–17.0)
Immature Granulocytes: 0 %
Lymphocytes Relative: 11 %
Lymphs Abs: 0.8 10*3/uL (ref 0.7–4.0)
MCH: 28.6 pg (ref 26.0–34.0)
MCHC: 32.7 g/dL (ref 30.0–36.0)
MCV: 87.5 fL (ref 80.0–100.0)
Monocytes Absolute: 0.6 10*3/uL (ref 0.1–1.0)
Monocytes Relative: 8 %
Neutro Abs: 5.6 10*3/uL (ref 1.7–7.7)
Neutrophils Relative %: 80 %
Platelets: 219 10*3/uL (ref 150–400)
RBC: 5.28 MIL/uL (ref 4.22–5.81)
RDW: 12 % (ref 11.5–15.5)
WBC: 7.1 10*3/uL (ref 4.0–10.5)
nRBC: 0 % (ref 0.0–0.2)

## 2018-09-15 LAB — COMPREHENSIVE METABOLIC PANEL
ALT: 33 U/L (ref 0–44)
AST: 24 U/L (ref 15–41)
Albumin: 3.8 g/dL (ref 3.5–5.0)
Alkaline Phosphatase: 69 U/L (ref 38–126)
Anion gap: 10 (ref 5–15)
BUN: 15 mg/dL (ref 6–20)
CO2: 24 mmol/L (ref 22–32)
Calcium: 8.6 mg/dL — ABNORMAL LOW (ref 8.9–10.3)
Chloride: 103 mmol/L (ref 98–111)
Creatinine, Ser: 0.94 mg/dL (ref 0.61–1.24)
GFR calc Af Amer: >60 mL/min (ref >60)
GFR calc non Af Amer: >60 mL/min (ref >60)
Glucose, Bld: 114 mg/dL — ABNORMAL HIGH (ref 70–99)
Potassium: 3.8 mmol/L (ref 3.5–5.1)
Sodium: 137 mmol/L (ref 135–145)
Total Bilirubin: 1.1 mg/dL (ref 0.3–1.2)
Total Protein: 7.1 g/dL (ref 6.5–8.1)

## 2018-09-15 LAB — URINALYSIS, ROUTINE W REFLEX MICROSCOPIC
Bilirubin Urine: NEGATIVE
Glucose, UA: NEGATIVE mg/dL
Hgb urine dipstick: NEGATIVE
Ketones, ur: NEGATIVE mg/dL
Leukocytes, UA: NEGATIVE
Nitrite: NEGATIVE
Protein, ur: NEGATIVE mg/dL
Specific Gravity, Urine: 1.029 (ref 1.005–1.030)
pH: 6 (ref 5.0–8.0)

## 2018-09-15 LAB — I-STAT TROPONIN, ED
Troponin i, poc: 0 ng/mL (ref 0.00–0.08)
Troponin i, poc: 0 ng/mL (ref 0.00–0.08)

## 2018-09-15 LAB — LIPASE, BLOOD: Lipase: 24 U/L (ref 11–51)

## 2018-09-15 MED ORDER — IOHEXOL 300 MG/ML  SOLN
100.0000 mL | Freq: Once | INTRAMUSCULAR | Status: AC | PRN
  Administered 2018-09-15: 100 mL via INTRAVENOUS

## 2018-09-15 MED ORDER — SODIUM CHLORIDE 0.9 % IV BOLUS
1000.0000 mL | Freq: Once | INTRAVENOUS | Status: AC
  Administered 2018-09-15: 1000 mL via INTRAVENOUS

## 2018-09-15 MED ORDER — ONDANSETRON HCL 4 MG/2ML IJ SOLN
4.0000 mg | Freq: Once | INTRAMUSCULAR | Status: AC
  Administered 2018-09-15: 4 mg via INTRAVENOUS
  Filled 2018-09-15: qty 2

## 2018-09-15 MED ORDER — ONDANSETRON 4 MG PO TBDP: 4.0000 mg | ORAL_TABLET | Freq: Three times a day (TID) | ORAL | 0 refills | Status: DC | PRN

## 2018-09-15 NOTE — ED Notes (Signed)
Patient verbalizes understanding of discharge instructions. Opportunity for questioning and answers were provided. Armband removed by staff, pt discharged from ED. Prescriptions and follow up care reviewed. Pt ambulatory lobby.

## 2018-09-15 NOTE — ED Notes (Signed)
ED Provider at bedside. 

## 2018-09-15 NOTE — ED Provider Notes (Signed)
[facility] DEPART[facility] Provider Note   CSN: [id number] Arrival date & time: 09/15/18  1146     History   Chief Complaint Chief Complaint  Patient presents with  . Abdominal Pain  . Near Syncope    HPI Jon Vasquez is a 52 y.o. male presents for evaluation of acute onset, intermittent abdominal pain beginning this morning and 2 episodes of near syncope yesterday.  He reports that yesterday around midday while he was in his truck he began to feel lightheaded.  He states this lasted approximately 2 minutes and then resolved and had no associated chest pain or shortness of breath.  Later in the day while walking around a gym but not exerting himself significantly he began to feel recurrence of lightheadedness.  He states that he began to feel flushed, diaphoretic, nauseated and short of breath.  The symptoms lasted approximately 10 minutes before resolving again.  He went to an urgent care who recommended presentation to the ED for further evaluation.  It sounds like they also obtained a Hemoccult which was positive but he denies any melena or hematochezia.   At around 1 AM this morning he awoke feeling severe upper abdominal and periumbilical pain.  Since then he has had several episodes of nonbloody nonbilious emesis and watery nonbloody diarrhea.  The pain does not radiate, no aggravating or alleviating factors noted.  He currently does not have any abdominal pain.  He did have 2 bacon egg and sausage biscuits for breakfast yesterday morning prior to symptom onset but denies any other suspicious food intake.  No recent travel or treatment with antibiotics.  Denies recent surgeries, leg swelling, hemoptysis, prior history of DVT or PE, or hormone replacement therapy.  He is a non-smoker, denies recreational drug use or excessive alcohol intake.  He does have a family history of significant heart disease.   The history is provided by the patient.    Past  Medical History:  Diagnosis Date  . Bronchitis   . Pneumonia     There are no active problems to display for this patient.   Past Surgical History:  Procedure Laterality Date  . APPENDECTOMY          Home Medications    Prior to Admission medications   Medication Sig Start Date End Date Taking? Authorizing Provider  acetaminophen (TYLENOL) 500 MG chewable tablet Chew 1,000 mg by mouth every 6 (six) hours as needed.    [provider]  diazepam (VALIUM) 5 MG tablet Take 1 tablet (5 mg total) by mouth 2 (two) times daily. 09/11/16   Shary Decamp, PA-C  fexofenadine-pseudoephedrine (ALLEGRA-D) 60-120 MG per tablet Take 1 tablet by mouth every 12 (twelve) hours. 10/16/12   Moreno-Coll, Adlih, MD  guaiFENesin (MUCINEX) 600 MG 12 hr tablet Take by mouth 2 (two) times daily.    [provider]  HYDROcodone-acetaminophen (HYCET) 7.5-325 mg/15 ml solution Take 15 mLs by mouth at bedtime as needed for pain or cough. 10/16/12   Moreno-Coll, Adlih, MD  ibuprofen (ADVIL,MOTRIN) 600 MG tablet Take 1 tablet (600 mg total) by mouth every 8 (eight) hours as needed for pain or fever. 10/16/12   Moreno-Coll, Adlih, MD  magic mouthwash w/lidocaine SOLN Take 5 mLs by mouth 3 (three) times daily as needed for mouth pain. 02/20/17   Barnet Glasgow, NP  methylPREDNISolone (MEDROL DOSEPAK) 4 MG TBPK tablet follow package directions ,start on fri , take until finished 01/26/15   Kindl, Nelda Severe, MD  ondansetron (ZOFRAN ODT) 4 MG disintegrating tablet Take 1 tablet (4 mg total) by mouth every 8 (eight) hours as needed for nausea or vomiting. 09/15/18   Rodell Perna A, PA-C  oseltamivir (TAMIFLU) 75 MG capsule Take 1 capsule (75 mg total) by mouth every 12 (twelve) hours. 10/16/12   Moreno-Coll, Adlih, MD  Pseudoeph-Doxylamine-DM-APAP (NYQUIL PO) Take by mouth.    [provider]    Family History Family History  Problem Relation Age of Onset  . Cancer Mother   . Diabetes Mother   .  Heart disease Father   . Pulmonary fibrosis Father     Social History Social History   Tobacco Use  . Smoking status: Never Smoker  . Smokeless tobacco: Never Used  Substance Use Topics  . Alcohol use: No  . Drug use: No     Allergies   Patient has no known allergies.   Review of Systems Review of Systems  Constitutional: Positive for diaphoresis. Negative for chills and fever.  Respiratory: Positive for shortness of breath.   Cardiovascular: Negative for chest pain and leg swelling.  Gastrointestinal: Positive for abdominal pain, diarrhea, nausea and vomiting. Negative for blood in stool.  Genitourinary: Negative for dysuria, frequency, hematuria and urgency.  Neurological: Positive for light-headedness. Negative for syncope and headaches.  All other systems reviewed and are negative.    Physical Exam Updated Vital Signs BP 116/60   Pulse 88   Temp 97.9 F (36.6 C) (Oral)   Resp 16   Ht 6' (1.829 m)   Wt 117 kg   SpO2 100%   BMI 34.99 kg/m   Physical Exam Vitals signs and nursing note reviewed.  Constitutional:      General: He is not in acute distress.    Appearance: He is well-developed.  HENT:     Head: Normocephalic and atraumatic.  Eyes:     General:        Right eye: No discharge.        Left eye: No discharge.     Conjunctiva/sclera: Conjunctivae normal.  Neck:     Vascular: No JVD.     Trachea: No tracheal deviation.  Cardiovascular:     Rate and Rhythm: Normal rate.     Comments: 2+ radial and DP/PT pulses bilaterally, Homans sign absent bilaterally, no lower extremity edema, no palpable cords, compartments are soft  Pulmonary:     Effort: Pulmonary effort is normal.     Breath sounds: Normal breath sounds.  Abdominal:     General: Bowel sounds are increased. There is no distension.     Palpations: Abdomen is soft.     Tenderness: There is abdominal tenderness in the epigastric area and left upper quadrant. There is no right CVA  tenderness, left CVA tenderness, guarding or rebound. Negative signs include Murphy's sign, Rovsing's sign and McBurney's sign.  Skin:    General: Skin is warm and dry.     Findings: No erythema.  Neurological:     Mental Status: He is alert.  Psychiatric:        Behavior: Behavior normal.      ED Treatments / Results  Labs (all labs ordered are listed, but only abnormal results are displayed) Labs Reviewed  COMPREHENSIVE METABOLIC PANEL - Abnormal; Notable for the following components:      Result Value   Glucose, Bld 114 (*)    Calcium 8.6 (*)    All other components within normal limits  URINALYSIS, ROUTINE W REFLEX MICROSCOPIC -  Abnormal; Notable for the following components:   APPearance HAZY (*)    All other components within normal limits  GASTROINTESTINAL PANEL BY PCR, STOOL (REPLACES STOOL CULTURE)  CBC WITH DIFFERENTIAL/PLATELET  LIPASE, BLOOD  I-STAT TROPONIN, ED  I-STAT TROPONIN, ED    EKG EKG Interpretation  Date/Time:  Tuesday September 15 2018 12:15:10 EST Ventricular Rate:  96 PR Interval:    QRS Duration: 98 QT Interval:  346 QTC Calculation: 438 R Axis:   5 Text Interpretation:  Sinus rhythm Abnormal R-wave progression, early transition Baseline wander in lead(s) V4 Confirmed by Virgel Manifold [phone]) on 09/15/2018 4:34:23 PM   Radiology Ct Abdomen Pelvis W Contrast  Addendum Date: 09/15/2018   ADDENDUM REPORT: 09/15/2018 16:22 ADDENDUM: In the impression it should state status post appendectomy only and not cholecystectomy. The body of the report is correct and findings were discussed with [facility] to clarify. Electronically Signed   By: Ashley Royalty M.D.   On: 09/15/2018 16:22   Result Date: 09/15/2018 CLINICAL DATA:  Two near syncopal episodes yesterday. Abdominal pain, gastroenteritis or colitis is suspected. EXAM: CT ABDOMEN AND PELVIS WITH CONTRAST TECHNIQUE: Multidetector CT imaging of the abdomen and pelvis was performed using the standard  protocol following bolus administration of intravenous contrast. CONTRAST:  100 cc Omnipaque 300 COMPARISON:  07/12/2000 CT report FINDINGS: Lower chest: No acute abnormality. Hepatobiliary: No focal liver abnormality is seen. No gallstones, gallbladder wall thickening, or biliary dilatation. Pancreas: Unremarkable. No pancreatic ductal dilatation or surrounding inflammatory changes. Spleen: No splenic injury or perisplenic hematoma. Adrenals/Urinary Tract: Adrenal glands are unremarkable. Kidneys are normal, without renal calculi, focal lesion, or hydronephrosis. Bladder is unremarkable. Stomach/Bowel: The stomach is decompressed in appearance. There is normal small bowel rotation in the duodenal sweep and ligament of Treitz are unremarkable. Bowel dilatation or obstruction is seen. The patient is status post cholecystectomy. Scattered colonic diverticulosis without acute diverticulitis is seen. No transmural thickening is noted. Vascular/Lymphatic: No significant vascular findings are present. No enlarged abdominal or pelvic lymph nodes. Reproductive: Prostate is unremarkable. Other: Bilateral inguinal hernias are identified, on the right containing a small amount of properitoneal fat and on the left, properitoneal fat and a tiny segment of non incarcerated appearing large bowel. Musculoskeletal: No acute or significant osseous findings. IMPRESSION: 1. Bilateral inguinal hernias, on the right containing a small amount of properitoneal fat and on the left, properitoneal fat and a tiny segment of non incarcerated appearing large bowel on the left. 2. Status post cholecystectomy and appendectomy. 3. Colonic diverticulosis without acute diverticulitis. Electronically Signed: By: Ashley Royalty M.D. On: 09/15/2018 15:00    Procedures Procedures (including critical care time)  Medications Ordered in ED Medications  sodium chloride 0.9 % bolus 1,000 mL (0 mLs Intravenous Stopped 09/15/18 1633)  ondansetron (ZOFRAN)  injection 4 mg (4 mg Intravenous Given 09/15/18 1354)  iohexol (OMNIPAQUE) 300 MG/ML solution 100 mL (100 mLs Intravenous Contrast Given 09/15/18 1453)     Initial Impression / Assessment and Plan / ED Course  I have reviewed the triage vital signs and the nursing notes.  Pertinent labs & imaging results that were available during my care of the patient were reviewed by me and considered in my medical decision making (see chart for details).     Patient presenting for evaluation of near syncopal episodes yesterday and acute onset of nausea vomiting and diarrhea today.  He is afebrile, vital signs are stable.  He is nontoxic in appearance.  No peritoneal signs on  examination of the abdomen.  He does have upper abdominal tenderness.  Will obtain lab work and imaging and reassess.  Labs reviewed by me show no leukocytosis, no anemia, no renal insufficiency.  No metabolic derangements.  LFTs and lipase within normal limits.  UA does not suggest UTI or nephrolithiasis.  Serial troponins are negative and EKG shows normal sinus rhythm. No ischemic changes. Doubt ACS/MI to explain his lightheadedness and he denies chest pain. Near syncope sounds more vasovagal in nature.  Imaging of the abdomen and pelvis shows bilateral inguinal hernias, on the right containing only fat and on the left containing fat and a tiny segment of nonincarcerated appearing large bowel.  No evidence of acute surgical abdominal pathology including obstruction, perforation, cholecystitis, or dissection.  He was unable to give [place] a stool sample in the ED today.  On reevaluation he is resting comfortably no apparent distress.  Serial abdominal examinations remain benign and he is tolerating p.o. fluids without difficulty.  Suspect likely viral gastroenteritis.  Discussed advancing diet slowly and will discharge with Zofran.  Recommend follow-up with general surgery for reevaluation of his bilateral inguinal hernias which do not appear to  be incarcerated or strangulated at this time.  Discussed strict ED return precautions. Pt verbalized understanding of and agreement with plan and is safe for discharge home at this time. No complaints prior to discharge.    Final Clinical Impressions(s) / ED Diagnoses   Final diagnoses:  Bilateral inguinal hernia without obstruction or gangrene, recurrence not specified  Pain of upper abdomen  Nausea vomiting and diarrhea  Near syncope    ED Discharge Orders         Ordered    ondansetron (ZOFRAN ODT) 4 MG disintegrating tablet  Every 8 hours PRN     09/15/18 1623           Renita Papa, PA-C 09/15/18 1636    Charlesetta Shanks, MD 09/25/18 1539

## 2018-09-15 NOTE — ED Notes (Signed)
Pt tolerating PO fluids. Pt denies nausea/vomitting since fluid intake.

## 2018-09-15 NOTE — ED Notes (Signed)
Pt given PO fluids at this time 

## 2018-09-15 NOTE — ED Triage Notes (Addendum)
Pt endorses yesterday having two near syncopal episode (hot, clammy, dizzy) the first lasting 2 minutes and the second lasting 10 minutes and going to the urgent care. Pt reports once he sat down in the [facility] he felt better. Pt was told he had blood in his stool at the urgent care. They did not get blood work, pt was instructed to come to ER. This morning at 0100 pt starting having abdominal pain and N/V/D. Pt endorses 11 episodes of vomiting and diarrhea. Pt is alert, oriented, and ambulatory on arrival. Pt denies any current abdominal pain, but endorses some diarrhea before coming into room.

## 2018-09-15 NOTE — Discharge Instructions (Signed)
1. Medications:  Take Zofran as needed for nausea.  Let this medicine dissolve under your tongue.  Wait around 20 minutes before eating or drinking after taking this medication. 2. Treatment: rest, drink plenty of fluids, advance diet slowly.  Start with water and broth then advance to bland foods that will not upset your stomach such as crackers, mashed potatoes, and peanut butter.  Take probiotic or yogurt daily to help recolonize the good bacteria in your gut. 3. Follow Up: Please followup with your primary doctor in 3 days for discussion of your diagnoses and further evaluation after today's visit; if you do not have a primary care doctor use the resource guide provided to find one; Please return to the ER for persistent vomiting, high fevers or worsening symptoms  Your CT scan showed evidence of inguinal hernias on both sides.  This may eventually require repair by general surgeon.  You can follow-up with them on an outpatient basis.  In the meantime, try to avoid heavy lifting or repetitive bending.

## 2018-10-30 DIAGNOSIS — K4020 Bilateral inguinal hernia, without obstruction or gangrene, not specified as recurrent: Secondary | ICD-10-CM | POA: Diagnosis not present

## 2018-12-29 DIAGNOSIS — K4020 Bilateral inguinal hernia, without obstruction or gangrene, not specified as recurrent: Secondary | ICD-10-CM | POA: Diagnosis not present

## 2019-03-04 DIAGNOSIS — K219 Gastro-esophageal reflux disease without esophagitis: Secondary | ICD-10-CM | POA: Diagnosis not present

## 2019-03-04 DIAGNOSIS — K4020 Bilateral inguinal hernia, without obstruction or gangrene, not specified as recurrent: Secondary | ICD-10-CM | POA: Diagnosis not present

## 2019-03-04 DIAGNOSIS — Z9049 Acquired absence of other specified parts of digestive tract: Secondary | ICD-10-CM | POA: Diagnosis not present

## 2019-03-04 DIAGNOSIS — Z9889 Other specified postprocedural states: Secondary | ICD-10-CM | POA: Diagnosis not present

## 2019-04-16 DIAGNOSIS — W540XXA Bitten by dog, initial encounter: Secondary | ICD-10-CM | POA: Diagnosis not present

## 2019-04-16 DIAGNOSIS — S70312A Abrasion, left thigh, initial encounter: Secondary | ICD-10-CM | POA: Diagnosis not present

## 2019-04-16 DIAGNOSIS — E860 Dehydration: Secondary | ICD-10-CM | POA: Diagnosis not present

## 2019-06-09 DIAGNOSIS — Z20818 Contact with and (suspected) exposure to other bacterial communicable diseases: Secondary | ICD-10-CM | POA: Diagnosis not present

## 2019-06-09 DIAGNOSIS — J029 Acute pharyngitis, unspecified: Secondary | ICD-10-CM | POA: Diagnosis not present

## 2019-06-09 DIAGNOSIS — Z20828 Contact with and (suspected) exposure to other viral communicable diseases: Secondary | ICD-10-CM | POA: Diagnosis not present

## 2019-06-09 DIAGNOSIS — J309 Allergic rhinitis, unspecified: Secondary | ICD-10-CM | POA: Diagnosis not present

## 2019-06-20 DIAGNOSIS — J209 Acute bronchitis, unspecified: Secondary | ICD-10-CM | POA: Diagnosis not present

## 2019-06-20 DIAGNOSIS — R062 Wheezing: Secondary | ICD-10-CM | POA: Diagnosis not present

## 2019-06-20 DIAGNOSIS — R05 Cough: Secondary | ICD-10-CM | POA: Diagnosis not present

## 2019-08-30 DIAGNOSIS — U071 COVID-19: Secondary | ICD-10-CM | POA: Diagnosis not present

## 2019-09-02 DIAGNOSIS — Z20828 Contact with and (suspected) exposure to other viral communicable diseases: Secondary | ICD-10-CM | POA: Diagnosis not present

## 2019-10-14 DIAGNOSIS — Z6834 Body mass index (BMI) 34.0-34.9, adult: Secondary | ICD-10-CM | POA: Diagnosis not present

## 2019-10-14 DIAGNOSIS — Z1331 Encounter for screening for depression: Secondary | ICD-10-CM | POA: Diagnosis not present

## 2019-10-14 DIAGNOSIS — M25551 Pain in right hip: Secondary | ICD-10-CM | POA: Diagnosis not present

## 2019-10-14 DIAGNOSIS — M545 Low back pain: Secondary | ICD-10-CM | POA: Diagnosis not present

## 2019-10-15 DIAGNOSIS — M1611 Unilateral primary osteoarthritis, right hip: Secondary | ICD-10-CM | POA: Diagnosis not present

## 2019-10-15 DIAGNOSIS — M25551 Pain in right hip: Secondary | ICD-10-CM | POA: Diagnosis not present

## 2019-10-25 ENCOUNTER — Encounter: Payer: Self-pay | Admitting: Gastroenterology

## 2019-11-01 DIAGNOSIS — M1611 Unilateral primary osteoarthritis, right hip: Secondary | ICD-10-CM | POA: Diagnosis not present

## 2019-11-13 DIAGNOSIS — M1611 Unilateral primary osteoarthritis, right hip: Secondary | ICD-10-CM | POA: Diagnosis not present

## 2019-11-29 DIAGNOSIS — M1611 Unilateral primary osteoarthritis, right hip: Secondary | ICD-10-CM | POA: Diagnosis not present

## 2019-11-29 DIAGNOSIS — S76311A Strain of muscle, fascia and tendon of the posterior muscle group at thigh level, right thigh, initial encounter: Secondary | ICD-10-CM | POA: Diagnosis not present

## 2019-12-06 DIAGNOSIS — M25651 Stiffness of right hip, not elsewhere classified: Secondary | ICD-10-CM | POA: Diagnosis not present

## 2019-12-06 DIAGNOSIS — M25551 Pain in right hip: Secondary | ICD-10-CM | POA: Diagnosis not present

## 2019-12-06 DIAGNOSIS — M6281 Muscle weakness (generalized): Secondary | ICD-10-CM | POA: Diagnosis not present

## 2019-12-09 DIAGNOSIS — M25651 Stiffness of right hip, not elsewhere classified: Secondary | ICD-10-CM | POA: Diagnosis not present

## 2019-12-09 DIAGNOSIS — M6281 Muscle weakness (generalized): Secondary | ICD-10-CM | POA: Diagnosis not present

## 2019-12-09 DIAGNOSIS — M25551 Pain in right hip: Secondary | ICD-10-CM | POA: Diagnosis not present

## 2019-12-13 ENCOUNTER — Other Ambulatory Visit: Payer: Self-pay

## 2019-12-13 ENCOUNTER — Ambulatory Visit (AMBULATORY_SURGERY_CENTER): Payer: Self-pay | Admitting: *Deleted

## 2019-12-13 VITALS — Temp 97.3°F | Ht 72.5 in | Wt 263.0 lb

## 2019-12-13 DIAGNOSIS — Z8371 Family history of colonic polyps: Secondary | ICD-10-CM

## 2019-12-13 MED ORDER — SUPREP BOWEL PREP KIT 17.5-3.13-1.6 GM/177ML PO SOLN: 1.0000 | Freq: Once | ORAL | 0 refills | Status: AC

## 2019-12-13 NOTE — Progress Notes (Signed)
End of December or early January pt had COVID - unsure of date- will forgo COVID testing as its right at 90 days prior to colon and since pt unsure if early January and he has no s/s, will not pre screen    No egg or soy allergy known to patient  No issues with past sedation with any surgeries  or procedures, no intubation problems  No diet pills per patient No home 02 use per patient  No blood thinners per patient  Pt denies issues with constipation  No A fib or A flutter  EMMI video sent to pt's e mail   Due to the COVID-19 pandemic we are asking patients to follow these guidelines. Please only bring one care partner. Please be aware that your care partner may wait in the car in the parking lot or if they feel like they will be too hot to wait in the car, they may wait in the lobby on the 4th floor. All care partners are required to wear a mask the entire time (we do not have any that we can provide them), they need to practice social distancing, and we will do a Covid check for all patient's and care partners when you arrive. Also we will check their temperature and your temperature. If the care partner waits in their car they need to stay in the parking lot the entire time and we will call them on their cell phone when the patient is ready for discharge so they can bring the car to the front of the building. Also all patient's will need to wear a mask into building.

## 2019-12-14 DIAGNOSIS — M6281 Muscle weakness (generalized): Secondary | ICD-10-CM | POA: Diagnosis not present

## 2019-12-14 DIAGNOSIS — M25551 Pain in right hip: Secondary | ICD-10-CM | POA: Diagnosis not present

## 2019-12-14 DIAGNOSIS — M25651 Stiffness of right hip, not elsewhere classified: Secondary | ICD-10-CM | POA: Diagnosis not present

## 2019-12-16 DIAGNOSIS — M25551 Pain in right hip: Secondary | ICD-10-CM | POA: Diagnosis not present

## 2019-12-16 DIAGNOSIS — M25651 Stiffness of right hip, not elsewhere classified: Secondary | ICD-10-CM | POA: Diagnosis not present

## 2019-12-16 DIAGNOSIS — M6281 Muscle weakness (generalized): Secondary | ICD-10-CM | POA: Diagnosis not present

## 2019-12-21 DIAGNOSIS — M6281 Muscle weakness (generalized): Secondary | ICD-10-CM | POA: Diagnosis not present

## 2019-12-21 DIAGNOSIS — M25651 Stiffness of right hip, not elsewhere classified: Secondary | ICD-10-CM | POA: Diagnosis not present

## 2019-12-21 DIAGNOSIS — M25551 Pain in right hip: Secondary | ICD-10-CM | POA: Diagnosis not present

## 2019-12-23 ENCOUNTER — Other Ambulatory Visit: Payer: Self-pay

## 2019-12-23 ENCOUNTER — Encounter: Payer: Self-pay | Admitting: Gastroenterology

## 2019-12-23 ENCOUNTER — Ambulatory Visit (AMBULATORY_SURGERY_CENTER): Payer: BC Managed Care – PPO | Admitting: Gastroenterology

## 2019-12-23 VITALS — BP 111/65 | HR 75 | Temp 97.1°F | Resp 13 | Ht 72.0 in | Wt 263.0 lb

## 2019-12-23 DIAGNOSIS — Z1211 Encounter for screening for malignant neoplasm of colon: Secondary | ICD-10-CM | POA: Diagnosis not present

## 2019-12-23 DIAGNOSIS — Z8371 Family history of colonic polyps: Secondary | ICD-10-CM

## 2019-12-23 DIAGNOSIS — K635 Polyp of colon: Secondary | ICD-10-CM

## 2019-12-23 DIAGNOSIS — D125 Benign neoplasm of sigmoid colon: Secondary | ICD-10-CM

## 2019-12-23 MED ORDER — SODIUM CHLORIDE 0.9 % IV SOLN: 500.0000 mL | Freq: Once | INTRAVENOUS | Status: DC

## 2019-12-23 NOTE — Op Note (Signed)
[place] Patient Name: Jon Vasquez Procedure Date: 12/23/2019 10:04 AM MRN: [id number] Endoscopist: Jackquline Denmark , MD Age: 54 Referring MD:  Date of Birth: 1966-08-06 Gender: Male Account #: [id number] Procedure:                Colonoscopy Indications:              Colon cancer screening in patient at increased                            risk: Family history of 1st-degree relative with                            colon polyps. H/O polyps. Medicines:                Monitored Anesthesia Care Procedure:                Pre-Anesthesia Assessment:                           - Prior to the procedure, a History and Physical                            was performed, and patient medications and                            allergies were reviewed. The patient's tolerance of                            previous anesthesia was also reviewed. The risks                            and benefits of the procedure and the sedation                            options and risks were discussed with the patient.                            All questions were answered, and informed consent                            was obtained. Prior Anticoagulants: The patient has                            taken no previous anticoagulant or antiplatelet                            agents. ASA Grade Assessment: II - A patient with                            mild systemic disease. After reviewing the risks                            and benefits, the patient was deemed in  satisfactory condition to undergo the procedure.                           After obtaining informed consent, the colonoscope                            was passed under direct vision. Throughout the                            procedure, the patient's blood pressure, pulse, and                            oxygen saturations were monitored continuously. The                            Colonoscope was introduced through the  anus and                            advanced to the 1 cm into the ileum. The                            colonoscopy was performed without difficulty. The                            patient tolerated the procedure well. The quality                            of the bowel preparation was good. The terminal                            ileum, ileocecal valve, appendiceal orifice, and                            rectum were photographed. Scope In: 10:11:46 AM Scope Out: 10:22:50 AM Scope Withdrawal Time: 0 hours 8 minutes 47 seconds  Total Procedure Duration: 0 hours 11 minutes 4 seconds  Findings:                 A 4 mm polyp was found in the mid sigmoid colon.                            The polyp was sessile. The polyp was removed with a                            cold biopsy forceps. Resection and retrieval were                            complete.                           A few small-mouthed diverticula were found in the                            sigmoid colon, descending colon, ascending colon  and cecum.                           Non-bleeding internal hemorrhoids were found during                            retroflexion. The hemorrhoids were small. Healed                            posterior anal fissure was also noted                           The terminal ileum appeared normal.                           The exam was otherwise without abnormality on                            direct and retroflexion views. Complications:            No immediate complications. Estimated Blood Loss:     Estimated blood loss: none. Impression:               - Diminutive colonic polyp s/p polypectomy.                           - Mild pancolonic diverticulosis.                           - Otherwise normal colonoscopy. Recommendation:           - Patient has a contact number available for                            emergencies. The signs and symptoms of potential                             delayed complications were discussed with the                            patient. Return to normal activities tomorrow.                            Written discharge instructions were provided to the                            patient.                           - High fiber diet.                           - Continue present medications.                           - Await pathology results.                           -  Repeat colonoscopy for surveillance based on                            pathology results.                           - The findings and recommendations were discussed                            with the patient's family Suanne Marker). Jackquline Denmark, MD 12/23/2019 10:28:58 AM This report has been signed electronically.

## 2019-12-23 NOTE — Progress Notes (Signed)
Called to room to assist during endoscopic procedure.  Patient ID and intended procedure confirmed with present staff. Received instructions for my participation in the procedure from the performing physician.  

## 2019-12-23 NOTE — Patient Instructions (Signed)
INFORMATION ON POLYPS, DIVERTICULOSIS AND HEMORRHOIDS GIVEN TO YOU TODAY.  AWAIT PATHOLOGY RESULTS.  EAT A HIGH FIBER DIET.  YOU HAD AN ENDOSCOPIC PROCEDURE TODAY AT [facility]:   Refer to the procedure report that was given to you for any specific questions about what was found during the examination.  If the procedure report does not answer your questions, please call your gastroenterologist to clarify.  If you requested that your care partner not be given the details of your procedure findings, then the procedure report has been included in a sealed envelope for you to review at your convenience later.  YOU SHOULD EXPECT: Some feelings of bloating in the abdomen. Passage of more gas than usual.  Walking can help get rid of the air that was put into your GI tract during the procedure and reduce the bloating. If you had a lower endoscopy (such as a colonoscopy or flexible sigmoidoscopy) you may notice spotting of blood in your stool or on the toilet paper. If you underwent a bowel prep for your procedure, you may not have a normal bowel movement for a few days.  Please Note:  You might notice some irritation and congestion in your nose or some drainage.  This is from the oxygen used during your procedure.  There is no need for concern and it should clear up in a day or so.  SYMPTOMS TO REPORT IMMEDIATELY:   Following lower endoscopy (colonoscopy or flexible sigmoidoscopy):  Excessive amounts of blood in the stool  Significant tenderness or worsening of abdominal pains  Swelling of the abdomen that is new, acute  Fever of 100F or higher   For urgent or emergent issues, a gastroenterologist can be reached at any hour by calling [phone]. Do not use MyChart messaging for urgent concerns.    DIET:  We do recommend a small meal at first, but then you may proceed to your regular diet.  Drink plenty of fluids but you should avoid alcoholic beverages for 24  hours.  ACTIVITY:  You should plan to take it easy for the rest of today and you should NOT DRIVE or use heavy machinery until tomorrow (because of the sedation medicines used during the test).    FOLLOW UP: Our staff will call the number listed on your records 48-72 hours following your procedure to check on you and address any questions or concerns that you may have regarding the information given to you following your procedure. If we do not reach you, we will leave a message.  We will attempt to reach you two times.  During this call, we will ask if you have developed any symptoms of COVID 19. If you develop any symptoms (ie: fever, flu-like symptoms, shortness of breath, cough etc.) before then, please call [phone].  If you test positive for Covid 19 in the 2 weeks post procedure, please call and report this information to [place].    If any biopsies were taken you will be contacted by phone or by letter within the next 1-3 weeks.  Please call us at [phone] if you have not heard about the biopsies in 3 weeks.    SIGNATURES/CONFIDENTIALITY: You and/or your care partner have signed paperwork which will be entered into your electronic medical record.  These signatures attest to the fact that that the information above on your After Visit Summary has been reviewed and is understood.  Full responsibility of the confidentiality of this discharge information lies with you  and/or your care-partner. 

## 2019-12-23 NOTE — Progress Notes (Signed)
Pt's states no medical or surgical changes since previsit or office visit. 

## 2019-12-23 NOTE — Progress Notes (Signed)
A and O x3. Report to RN. Tolerated MAC anesthesia well.

## 2019-12-28 ENCOUNTER — Telehealth: Payer: Self-pay

## 2019-12-28 NOTE — Telephone Encounter (Signed)
  Follow up Call-  Call back number 12/23/2019  Post procedure Call Back phone  # [phone]  Permission to leave phone message Yes  Some recent data might be hidden     Patient questions:  Do you have a fever, pain , or abdominal swelling? No. Pain Score  0 *  Have you tolerated food without any problems? Yes.    Have you been able to return to your normal activities? Yes.    Do you have any questions about your discharge instructions: Diet   No. Medications  No. Follow up visit  No.  Do you have questions or concerns about your Care? No.  Actions: * If pain score is 4 or above: No action needed, pain <4.

## 2019-12-30 DIAGNOSIS — M6281 Muscle weakness (generalized): Secondary | ICD-10-CM | POA: Diagnosis not present

## 2019-12-30 DIAGNOSIS — M25551 Pain in right hip: Secondary | ICD-10-CM | POA: Diagnosis not present

## 2019-12-30 DIAGNOSIS — M25651 Stiffness of right hip, not elsewhere classified: Secondary | ICD-10-CM | POA: Diagnosis not present

## 2020-01-09 ENCOUNTER — Encounter: Payer: Self-pay | Admitting: Gastroenterology

## 2020-09-29 DIAGNOSIS — H524 Presbyopia: Secondary | ICD-10-CM | POA: Diagnosis not present

## 2020-09-30 DIAGNOSIS — U071 COVID-19: Secondary | ICD-10-CM | POA: Diagnosis not present

## 2021-02-08 DIAGNOSIS — Z125 Encounter for screening for malignant neoplasm of prostate: Secondary | ICD-10-CM | POA: Diagnosis not present

## 2021-02-08 DIAGNOSIS — E7800 Pure hypercholesterolemia, unspecified: Secondary | ICD-10-CM | POA: Diagnosis not present

## 2021-02-08 DIAGNOSIS — R0600 Dyspnea, unspecified: Secondary | ICD-10-CM | POA: Diagnosis not present

## 2021-02-08 DIAGNOSIS — R079 Chest pain, unspecified: Secondary | ICD-10-CM | POA: Diagnosis not present

## 2021-02-08 DIAGNOSIS — Z6835 Body mass index (BMI) 35.0-35.9, adult: Secondary | ICD-10-CM | POA: Diagnosis not present

## 2021-02-12 DIAGNOSIS — M1990 Unspecified osteoarthritis, unspecified site: Secondary | ICD-10-CM | POA: Insufficient documentation

## 2021-02-12 DIAGNOSIS — K219 Gastro-esophageal reflux disease without esophagitis: Secondary | ICD-10-CM | POA: Insufficient documentation

## 2021-02-12 DIAGNOSIS — K5792 Diverticulitis of intestine, part unspecified, without perforation or abscess without bleeding: Secondary | ICD-10-CM | POA: Insufficient documentation

## 2021-02-12 DIAGNOSIS — J189 Pneumonia, unspecified organism: Secondary | ICD-10-CM | POA: Insufficient documentation

## 2021-02-12 DIAGNOSIS — J40 Bronchitis, not specified as acute or chronic: Secondary | ICD-10-CM | POA: Insufficient documentation

## 2021-02-12 NOTE — Progress Notes (Addendum)
Cardiology Office Note:    Date:  02/13/2021   ID:  Jon Vasquez, Jon Vasquez 1966-03-05, MRN [id number]  PCP:  Angelina Sheriff, MD  Cardiologist:  Shirlee More, MD   Referring MD: Angelina Sheriff, MD  ASSESSMENT:    1. Palpitations   2. Cardiac risk counseling   3. Family history of premature CAD    PLAN:    In order of problems listed above:  1. He has had episodic rapid heart rhythm generally with physical effort the last was not suggestive of atrial arrhythmia atrial fibrillation will undergo 2-week CO monitor if unrevealing to purchase the iPhone adapter to capture episodes in the future.  Insertional nature of symptoms undergo risk assessment with a stress Myoview. 2. Check CT calcium score with his family history of premature CAD, if his score is elevated he would benefit from statin therapy  Next appointment 6 to 8 weeks   Medication Adjustments/Labs and Tests Ordered: Current medicines are reviewed at length with the patient today.  Concerns regarding medicines are outlined above.  Orders Placed This Encounter  Procedures  . CT CARDIAC SCORING (SELF PAY ONLY)  . LONG TERM MONITOR (3-14 DAYS)  . MYOCARDIAL PERFUSION IMAGING  . EKG 12-Lead   No orders of the defined types were placed in this encounter.    Chief Complaint  Patient presents with  . Palpitations    History of Present Illness:    Jon Vasquez is a 55 y.o. male who is being seen today for the evaluation of chest pain at the request of Angelina Sheriff, MD.  I reviewed his office note from his primary care physician chest x-ray and labs were ordered copies were not sent.  He had recent [facility] screening which showed no atheroma sclerosis. He has a family history of premature CAD with his father surgery in his early 27s. He has no known history of congenital rheumatic heart disease heart murmur or atrial fibrillation. 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 1 week ago he had an episode he was outdoors walked into the bathroom he had diarrhea and afterwards had abdominal pain heart racing diaphoretic and when his pulse was checked it was 120 bpm.  It lasted 5 to 10 minutes. His concern is if he has a heart rhythm problem like atrial fibrillation and his family history of premature CAD Chest x-ray recently done records requested. EKG today is normal. Recent labs 02/09/2020 cholesterol 194 triglycerides 298 HDL 34 random glucose 132 creatinine 0.9 He is not having exertional angina shortness of breath has not had syncope no other episodes of palpitation  Past Medical History:  Diagnosis Date  . Arthritis   . Bronchitis   . Diverticulitis   . GERD (gastroesophageal reflux disease)    occasional   . Pneumonia     Past Surgical History:  Procedure Laterality Date  . APPENDECTOMY    . COLONOSCOPY    . POLYPECTOMY      Current Medications: Current Meds  Medication Sig  . cholecalciferol (VITAMIN D3) 25 MCG (1000 UNIT) tablet Take 1,000 Units by mouth daily.  Marland Kitchen ELDERBERRY PO Take 1 tablet by mouth daily.  . Multiple Vitamin (MULTIVITAMIN) tablet Take 1 tablet by mouth daily.  . naproxen sodium (ALEVE) 220 MG tablet Take 440 mg by mouth 2 (  two) times daily as needed (PAIN).  Marland Kitchen nitroGLYCERIN (NITROSTAT) 0.4 MG SL tablet Place 1 tablet under the tongue every 5 (five) minutes x 3 doses as needed for chest pain.  . Omega-3 Fatty Acids (OMEGA 3 PO) Take by mouth daily.  . Probiotic Product (PROBIOTIC PO) Take by mouth.  . zinc gluconate 50 MG tablet Take 1 tablet by mouth daily.     Allergies:   Patient has no known allergies.   Social History   Socioeconomic History  . Marital status: Legally Separated    Spouse name: Not on file  . Number of children: Not on file  . Years of education: Not on file  . Highest education  level: Not on file  Occupational History  . Not on file  Tobacco Use  . Smoking status: Never Smoker  . Smokeless tobacco: Never Used  Substance and Sexual Activity  . Alcohol use: No  . Drug use: No  . Sexual activity: Not on file  Other Topics Concern  . Not on file  Social History Narrative  . Not on file   Social Determinants of Health   Financial Resource Strain: Not on file  Food Insecurity: Not on file  Transportation Needs: Not on file  Physical Activity: Not on file  Stress: Not on file  Social Connections: Not on file     Family History: The patient's family history includes Cancer in his mother; Colon polyps in his mother; Diabetes in his mother; Heart disease in his father; Lung cancer in his mother; Pulmonary fibrosis in his father. There is no history of Colon cancer, Esophageal cancer, Rectal cancer, or Stomach cancer.  ROS:   ROS Please see the history of present illness.     All other systems reviewed and are negative.  EKGs/Labs/Other Studies Reviewed:    The following studies were reviewed today:   EKG:  EKG is  ordered today.  The ekg ordered today is personally reviewed and demonstrates sinus rhythm and is normal Chest x-ray 02/08/2021 showed hyperexpansion no active cardiopulmonary disease  Physical Exam:    VS:  Ht 6\' 1"  (1.854 m)   Wt 265 lb 6.4 oz (120.4 kg)   SpO2 97%   BMI 35.02 kg/m     Wt Readings from Last 3 Encounters:  02/13/21 265 lb 6.4 oz (120.4 kg)  12/23/19 263 lb (119.3 kg)  12/13/19 263 lb (119.3 kg)     GEN:  Well nourished, well developed in no acute distress HEENT: Normal NECK: No JVD; No carotid bruits LYMPHATICS: No lymphadenopathy CARDIAC: RRR, no murmurs, rubs, gallops RESPIRATORY:  Clear to auscultation without rales, wheezing or rhonchi  ABDOMEN: Soft, non-tender, non-distended MUSCULOSKELETAL:  No edema; No deformity  SKIN: Warm and dry NEUROLOGIC:  Alert and oriented x 3 PSYCHIATRIC:  Normal affect      Signed, Shirlee More, MD  02/13/2021 9:08 AM    [place]

## 2021-02-13 ENCOUNTER — Other Ambulatory Visit: Payer: Self-pay

## 2021-02-13 ENCOUNTER — Ambulatory Visit (INDEPENDENT_AMBULATORY_CARE_PROVIDER_SITE_OTHER): Payer: BC Managed Care – PPO | Admitting: Cardiology

## 2021-02-13 ENCOUNTER — Encounter: Payer: Self-pay | Admitting: Cardiology

## 2021-02-13 ENCOUNTER — Ambulatory Visit (INDEPENDENT_AMBULATORY_CARE_PROVIDER_SITE_OTHER): Payer: BC Managed Care – PPO

## 2021-02-13 VITALS — Ht 73.0 in | Wt 265.4 lb

## 2021-02-13 DIAGNOSIS — Z8249 Family history of ischemic heart disease and other diseases of the circulatory system: Secondary | ICD-10-CM | POA: Diagnosis not present

## 2021-02-13 DIAGNOSIS — Z7189 Other specified counseling: Secondary | ICD-10-CM

## 2021-02-13 DIAGNOSIS — R002 Palpitations: Secondary | ICD-10-CM

## 2021-02-13 NOTE — Patient Instructions (Addendum)
Medication Instructions:  Your physician recommends that you continue on your current medications as directed. Please refer to the Current Medication list given to you today.  *If you need a refill on your cardiac medications before your next appointment, please call your pharmacy*   Lab Work: None If you have labs (blood work) drawn today and your tests are completely normal, you will receive your results only by: Marland Kitchen MyChart Message (if you have MyChart) OR . A paper copy in the mail If you have any lab test that is abnormal or we need to change your treatment, we will call you to review the results.   Testing/Procedures: A zio monitor was ordered today. It will remain on for 14 days. You will then return monitor and event diary in provided box. It takes 1-2 weeks for report to be downloaded and returned to [place]. We will call you with the results. If monitor falls off or has orange flashing light, please call Zio for further instructions.     [facility] at [facility] [address], [place], [place] [address] Phone: [phone]    Please arrive 15 minutes prior to your appointment time for registration and insurance purposes.  The test will take approximately 3 to 4 hours to complete; you may bring reading material.  If someone comes with you to your appointment, they will need to remain in the main lobby due to limited space in the testing area. **If you are pregnant or breastfeeding, please notify the nuclear lab prior to your appointment**  How to prepare for your Myocardial Perfusion Test: . Do not eat or drink 3 hours prior to your test, except you may have water. . Do not consume products containing caffeine (regular or decaffeinated) 12 hours prior to your test. (ex: coffee, chocolate, sodas, tea). . Do bring a list of your current medications with you.  If not listed below, you may take your medications as normal. . Do wear comfortable  clothes (no dresses or overalls) and walking shoes, tennis shoes preferred (No heels or open toe shoes are allowed). . Do NOT wear cologne, perfume, aftershave, or lotions (deodorant is allowed). . If these instructions are not followed, your test will have to be rescheduled.  Please report to [address], [address] for your test.  If you have questions or concerns about your appointment, you can call the Nuclear Lab at [phone].  If you cannot keep your appointment, please provide 24 hours notification to the Nuclear Lab, to avoid a possible $50 charge to your account.  We have placed the order for you to have a CT calcium score completed. They will call you to schedule this appointment.    Follow-Up: At [facility], you and your health needs are our priority.  As part of our continuing mission to provide you with exceptional heart care, we have created designated Provider Care Teams.  These Care Teams include your primary Cardiologist (physician) and Advanced Practice Providers (APPs -  Physician Assistants and Nurse Practitioners) who all work together to provide you with the care you need, when you need it.  We recommend signing up for the patient portal called "MyChart".  Sign up information is provided on this After Visit Summary.  MyChart is used to connect with patients for Virtual Visits (Telemedicine).  Patients are able to view lab/test results, encounter notes, upcoming appointments, etc.  Non-urgent messages can be sent to your provider as well.  To learn more about what you can do with MyChart, go to [email or web address].    Your next appointment:   8 week(s)  The format for your next appointment:   In Person  Provider:   Shirlee More, MD   Other Instructions [email or web address] [email or web address]        FDA-cleared, clinical grade mobile EKG monitor: Jodelle Red is the most clinically-validated mobile EKG used by the world's  leading cardiac care medical professionals With Basic service, know instantly if your heart rhythm is normal or if atrial fibrillation is detected, and email the last single EKG recording to yourself or your doctor Premium service, available for purchase through the Kardia app for $9.99 per month or $99 per year, includes unlimited history and storage of your EKG recordings, a monthly EKG summary report to share with your doctor, along with the ability to track your blood pressure, activity and weight Includes one KardiaMobile phone clip FREE SHIPPING: Standard delivery 1-3 business days. Orders placed by 11:00am PST will ship that afternoon. Otherwise, will ship next business day. All orders ship via [employer] from [place], [place]

## 2021-02-14 NOTE — Addendum Note (Signed)
Addended byShirlee More on: 02/14/2021 08:22 AM   Modules accepted: Orders

## 2021-02-21 ENCOUNTER — Telehealth (HOSPITAL_COMMUNITY): Payer: Self-pay | Admitting: *Deleted

## 2021-02-21 NOTE — Telephone Encounter (Signed)
Patient given detailed instructions per Myocardial Perfusion Study Information Sheet for the test on 02/22/21 at 0815. Patient notified to arrive 15 minutes early and that it is imperative to arrive on time for appointment to keep from having the test rescheduled.  If you need to cancel or reschedule your appointment, please call the office within 24 hours of your appointment. . Patient verbalized understanding.Lynnox Girten, Ranae Palms No mychart available

## 2021-02-22 ENCOUNTER — Other Ambulatory Visit: Payer: Self-pay

## 2021-02-22 ENCOUNTER — Ambulatory Visit (HOSPITAL_COMMUNITY): Payer: BC Managed Care – PPO | Attending: Internal Medicine

## 2021-02-22 DIAGNOSIS — R002 Palpitations: Secondary | ICD-10-CM | POA: Insufficient documentation

## 2021-02-22 LAB — MYOCARDIAL PERFUSION IMAGING
Estimated workload: 10.1 METS
Exercise duration (min): 9 min
LV dias vol: 83 mL (ref 62–150)
LV sys vol: 30 mL
MPHR: 164 {beats}/min
Peak HR: 162 {beats}/min
Percent HR: 98 %
Rest HR: 78 {beats}/min
SDS: 0
SRS: 0
SSS: 0
TID: 0.85

## 2021-02-22 MED ORDER — TECHNETIUM TC 99M TETROFOSMIN IV KIT
11.0000 | PACK | Freq: Once | INTRAVENOUS | Status: AC | PRN
  Administered 2021-02-22: 11 via INTRAVENOUS
  Filled 2021-02-22: qty 11

## 2021-02-22 MED ORDER — TECHNETIUM TC 99M TETROFOSMIN IV KIT
32.6000 | PACK | Freq: Once | INTRAVENOUS | Status: AC | PRN
  Administered 2021-02-22: 32.6 via INTRAVENOUS
  Filled 2021-02-22: qty 33

## 2021-02-23 ENCOUNTER — Telehealth: Payer: Self-pay

## 2021-02-23 NOTE — Telephone Encounter (Signed)
Patient notified of results.

## 2021-02-23 NOTE — Telephone Encounter (Signed)
-----   Message from Richardo Priest, MD sent at 02/23/2021  8:05 AM EDT ----- Good result this is normal.

## 2021-02-26 ENCOUNTER — Telehealth: Payer: Self-pay | Admitting: Cardiology

## 2021-02-26 ENCOUNTER — Telehealth: Payer: Self-pay

## 2021-02-26 DIAGNOSIS — R002 Palpitations: Secondary | ICD-10-CM | POA: Diagnosis not present

## 2021-02-26 NOTE — Telephone Encounter (Signed)
Jon Vasquez from [facility] calling with abnormal zio patch results.

## 2021-02-26 NOTE — Telephone Encounter (Signed)
Monitor shows rapid atrial fibrillation Start beta-blocker Lopressor 25 twice daily Should have an appointment for office follow-up At this time I would not place him on an anticoagulant

## 2021-02-26 NOTE — Telephone Encounter (Signed)
Spoke to [employer] just now and she was calling a critical report in for this patient. The patient was in rapid atrial fibrillation at 207 beats per minute for 60 seconds on May 27th 9:53 am.   The report is posted in [facility].

## 2021-02-27 MED ORDER — METOPROLOL TARTRATE 25 MG PO TABS: 25.0000 mg | ORAL_TABLET | Freq: Two times a day (BID) | ORAL | 3 refills | Status: DC

## 2021-02-27 NOTE — Telephone Encounter (Signed)
Spoke with patient regarding results and recommendation.  Patient verbalizes understanding and is agreeable to plan of care. Advised patient to call back with any issues or concerns.  

## 2021-02-27 NOTE — Addendum Note (Signed)
Addended by: Resa Miner I on: 02/27/2021 08:04 AM   Modules accepted: Orders

## 2021-02-28 ENCOUNTER — Other Ambulatory Visit: Payer: Self-pay

## 2021-02-28 ENCOUNTER — Ambulatory Visit (INDEPENDENT_AMBULATORY_CARE_PROVIDER_SITE_OTHER)
Admission: RE | Admit: 2021-02-28 | Discharge: 2021-02-28 | Disposition: A | Discharge status: Home / Self Care | Payer: Self-pay | Source: Ambulatory Visit | Attending: Cardiology | Admitting: Cardiology

## 2021-02-28 ENCOUNTER — Telehealth: Payer: Self-pay

## 2021-02-28 DIAGNOSIS — Z8249 Family history of ischemic heart disease and other diseases of the circulatory system: Secondary | ICD-10-CM

## 2021-02-28 NOTE — Telephone Encounter (Signed)
Spoke with patient regarding results and recommendation.  Patient verbalizes understanding and is agreeable to plan of care. Advised patient to call back with any issues or concerns.  

## 2021-02-28 NOTE — Telephone Encounter (Signed)
-----   Message from Richardo Priest, MD sent at 02/28/2021  3:50 PM EDT ----- Good result CT images are normal calcium score is 0 this puts him at low risk for heart attack over the next 5 to 7 years.

## 2021-03-05 DIAGNOSIS — M1611 Unilateral primary osteoarthritis, right hip: Secondary | ICD-10-CM | POA: Diagnosis not present

## 2021-03-05 DIAGNOSIS — M545 Low back pain, unspecified: Secondary | ICD-10-CM | POA: Diagnosis not present

## 2021-03-07 DIAGNOSIS — M545 Low back pain, unspecified: Secondary | ICD-10-CM | POA: Diagnosis not present

## 2021-04-05 ENCOUNTER — Other Ambulatory Visit: Payer: Self-pay

## 2021-04-05 NOTE — Progress Notes (Signed)
Cardiology Office Note:    Date:  04/06/2021   ID:  Jon Vasquez, DOB 01/09/1966, MRN [id number]  PCP:  Angelina Sheriff, MD  Cardiologist:  Shirlee More, MD    Referring MD: Angelina Sheriff, MD    ASSESSMENT:    1. Paroxysmal atrial fibrillation (HCC)   2. Family history of premature CAD   3. BMI 36.0-36.9,adult    PLAN:    In order of problems listed above:  When first seen by me he was having frequent episodes highly symptomatic of atrial fibrillation on beta-blocker no clinical recurrence his CHADS2 score is 0 continue beta-blocker and not anticoagulated and will refer to EP for consultation regarding continue beta-blocker, antiarrhythmic drug or consideration of EP catheter ablation.  I will plan to see back in the office as needed Despite his family history his coronary artery calcium score is 0 at this time would not initiate a statin or do further ischemic evaluation knowing that his perfusion study is normal. Obesity with atrial fibrillation would benefit from weight loss and I challenged him to lose 10% of his body mass   Next appointment: As needed after EP evaluation we will sign up for my chart   Medication Adjustments/Labs and Tests Ordered: Current medicines are reviewed at length with the patient today.  Concerns regarding medicines are outlined above.  No orders of the defined types were placed in this encounter.  No orders of the defined types were placed in this encounter.   Chief Complaint  Patient presents with   Follow-up   Atrial Fibrillation    History of Present Illness:    Jon Vasquez is a 55 y.o. male with a hx of chest pain family history of premature CAD and palpitation with rapid heart rhythm last seen 02/13/2021.  His CHADS2 score is 0.  Compliance with diet, lifestyle and medications: Yes  Is reassured by a calcium score of 0 not diabetic at low cardiovascular risk for MI and does not require statin therapy at this  time. He is improved with beta-blocker he has had no further symptomatic episodes. I challenged to lose 10% of his body mass with his atrial fibrillation his BMI is 36. I reviewed the natural history of atrial fibrillation with recurrences options of antiarrhythmic drug therapy or referral for EP catheter ablation he would like to explore the latter and I will give him a referral to electrophysiology.  I will see back as needed afterwards. I have not initiated anticoagulation. No chest pain edema shortness of breath no further palpitation he is pleased with the quality of his life and no side effect from his beta-blocker  For further restratification he had a coronary calcium score of 0 placing him at low risk over the next 5 to 7 years. Following that visit he underwent a stress Myoview study that exercise workload of 10 METS he had PVCs with exercise the ST segment response and blood pressure response were normal his myocardial perfusion images normal EF 64%.  Study Highlights    The left ventricular ejection fraction is normal (55-65%). Nuclear stress EF: 64%. There was no ST segment deviation noted during stress. No T wave inversion was noted during stress. The study is normal. This is a low risk study.   IMPRESSIONS PVC with exercise. Estimated exercise workload is 10 METS, which confers a survival benefit  An 8-day event monitor showed paroxysmal atrial fibrillation with a burden of 2% 3 episodes in the longest 47  minutes.  He was initiated on a beta-blocker. Past Medical History:  Diagnosis Date   Arthritis    Bronchitis    Diverticulitis    GERD (gastroesophageal reflux disease)    occasional    Pneumonia     Past Surgical History:  Procedure Laterality Date   APPENDECTOMY     COLONOSCOPY     POLYPECTOMY      Current Medications: Current Meds  Medication Sig   cholecalciferol (VITAMIN D3) 25 MCG (1000 UNIT) tablet Take 1,000 Units by mouth daily.   diclofenac  (VOLTAREN) 75 MG EC tablet Take 75 mg by mouth 2 (two) times daily.   ELDERBERRY PO Take 1 tablet by mouth daily.   metoprolol tartrate (LOPRESSOR) 25 MG tablet Take 1 tablet (25 mg total) by mouth 2 (two) times daily.   Multiple Vitamin (MULTIVITAMIN) tablet Take 1 tablet by mouth daily.   naproxen sodium (ALEVE) 220 MG tablet Take 440 mg by mouth 2 (two) times daily as needed for pain (PAIN).   nitroGLYCERIN (NITROSTAT) 0.4 MG SL tablet Place 1 tablet under the tongue every 5 (five) minutes x 3 doses as needed for chest pain.   Omega-3 Fatty Acids (OMEGA 3 PO) Take 1 capsule by mouth daily.   Probiotic Product (PROBIOTIC PO) Take 1 capsule by mouth daily.   zinc gluconate 50 MG tablet Take 1 tablet by mouth daily.     Allergies:   Patient has no known allergies.   Social History   Socioeconomic History   Marital status: Legally Separated    Spouse name: Not on file   Number of children: Not on file   Years of education: Not on file   Highest education level: Not on file  Occupational History   Not on file  Tobacco Use   Smoking status: Never   Smokeless tobacco: Never  Substance and Sexual Activity   Alcohol use: No   Drug use: No   Sexual activity: Not on file  Other Topics Concern   Not on file  Social History Narrative   Not on file   Social Determinants of Health   Financial Resource Strain: Not on file  Food Insecurity: Not on file  Transportation Needs: Not on file  Physical Activity: Not on file  Stress: Not on file  Social Connections: Not on file     Family History: The patient's family history includes Cancer in his mother; Colon polyps in his mother; Diabetes in his mother; Heart disease in his father; Lung cancer in his mother; Pulmonary fibrosis in his father. There is no history of Colon cancer, Esophageal cancer, Rectal cancer, or Stomach cancer. ROS:   Please see the history of present illness.    All other systems reviewed and are  negative.  EKGs/Labs/Other Studies Reviewed:    The following studies were reviewed today:    Recent labs 02/09/2020 cholesterol 194 triglycerides 298 HDL 34 random glucose 132 creatinine 0.9  Physical Exam:    VS:  BP 126/64 (BP Location: Right Arm, Patient Position: Sitting)   Pulse 88   Ht 6' (1.829 m)   Wt 268 lb 9.6 oz (121.8 kg)   SpO2 95%   BMI 36.43 kg/m     Wt Readings from Last 3 Encounters:  04/06/21 268 lb 9.6 oz (121.8 kg)  02/22/21 265 lb (120.2 kg)  02/13/21 265 lb 6.4 oz (120.4 kg)     GEN: Obese well nourished, well developed in no acute distress HEENT: Normal NECK: No  JVD; No carotid bruits LYMPHATICS: No lymphadenopathy CARDIAC: RRR, no murmurs, rubs, gallops RESPIRATORY:  Clear to auscultation without rales, wheezing or rhonchi  ABDOMEN: Soft, non-tender, non-distended MUSCULOSKELETAL:  No edema; No deformity  SKIN: Warm and dry NEUROLOGIC:  Alert and oriented x 3 PSYCHIATRIC:  Normal affect    Signed, Shirlee More, MD  04/06/2021 8:39 AM    [place]

## 2021-04-06 ENCOUNTER — Ambulatory Visit (INDEPENDENT_AMBULATORY_CARE_PROVIDER_SITE_OTHER): Payer: BC Managed Care – PPO | Admitting: Cardiology

## 2021-04-06 ENCOUNTER — Encounter: Payer: Self-pay | Admitting: Cardiology

## 2021-04-06 ENCOUNTER — Other Ambulatory Visit: Payer: Self-pay

## 2021-04-06 VITALS — BP 126/64 | HR 88 | Ht 72.0 in | Wt 268.6 lb

## 2021-04-06 DIAGNOSIS — Z8249 Family history of ischemic heart disease and other diseases of the circulatory system: Secondary | ICD-10-CM

## 2021-04-06 DIAGNOSIS — I480 Paroxysmal atrial fibrillation: Secondary | ICD-10-CM | POA: Diagnosis not present

## 2021-04-06 DIAGNOSIS — Z6836 Body mass index (BMI) 36.0-36.9, adult: Secondary | ICD-10-CM | POA: Diagnosis not present

## 2021-04-06 NOTE — Addendum Note (Signed)
Addended by: Truddie Hidden on: 04/06/2021 08:43 AM   Modules accepted: Orders

## 2021-04-06 NOTE — Patient Instructions (Signed)

## 2021-04-11 ENCOUNTER — Encounter: Payer: Self-pay | Admitting: Cardiology

## 2021-04-19 ENCOUNTER — Ambulatory Visit (INDEPENDENT_AMBULATORY_CARE_PROVIDER_SITE_OTHER): Payer: BC Managed Care – PPO

## 2021-04-19 ENCOUNTER — Other Ambulatory Visit: Payer: Self-pay

## 2021-04-19 ENCOUNTER — Ambulatory Visit (INDEPENDENT_AMBULATORY_CARE_PROVIDER_SITE_OTHER): Payer: BC Managed Care – PPO | Admitting: Podiatry

## 2021-04-19 ENCOUNTER — Encounter: Payer: Self-pay | Admitting: Podiatry

## 2021-04-19 DIAGNOSIS — M7732 Calcaneal spur, left foot: Secondary | ICD-10-CM

## 2021-04-19 DIAGNOSIS — M722 Plantar fascial fibromatosis: Secondary | ICD-10-CM

## 2021-04-19 DIAGNOSIS — M216X9 Other acquired deformities of unspecified foot: Secondary | ICD-10-CM

## 2021-04-19 MED ORDER — DEXAMETHASONE SODIUM PHOSPHATE 120 MG/30ML IJ SOLN
4.0000 mg | Freq: Once | INTRAMUSCULAR | Status: AC
  Administered 2021-04-19: 4 mg via INTRA_ARTICULAR

## 2021-04-19 MED ORDER — TRIAMCINOLONE ACETONIDE 40 MG/ML IJ SUSP
20.0000 mg | Freq: Once | INTRAMUSCULAR | Status: AC
  Administered 2021-04-19: 20 mg

## 2021-04-19 NOTE — Progress Notes (Signed)
  Subjective:  Patient ID: Jon Vasquez, male    DOB: 21-Oct-1965,  MRN: [id number]  Chief Complaint  Patient presents with   Plantar Fasciitis    I have some left heel pain and has been going on for several weeks    55 y.o. male presents with the above complaint. History confirmed with patient. States it hurts most after periods of rest. Works on his feet all day. Objective:  Physical Exam: warm, good capillary refill, no trophic changes or ulcerative lesions, normal DP and PT pulses, and normal sensory exam. Left Foot: tenderness to palpation medial calcaneal tuber, no pain with calcaneal squeeze, decreased ankle joint ROM, and +Silverskiold test  normal exam, no swelling, tenderness, instability; ligaments intact, full range of motion of all ankle/foot joints other than findings noted above.  Radiographs: X-ray of the left foot: no evidence of calcaneal stress fracture, plantar calcaneal spur, posterior calcaneal spur, and Haglund deformity noted  Assessment:   1. Plantar fasciitis of left foot   2. Equinus deformity of foot   3. Calcaneal spur of left foot    Plan:  Patient was evaluated and treated and all questions answered.  Plantar Fasciitis -XR reviewed with patient -Educated patient on stretching and icing of the affected limb -Plantar fascial brace dispensed -Injection delivered to the plantar fascia of the left foot.  Procedure: Injection Tendon/Ligament Consent: Verbal consent obtained. Location: Left plantar fascia at the glabrous junction; medial approach. Skin Prep: Alcohol. Injectate: 1 cc 0.5% marcaine plain, 1 cc betamethasone acetate-betamethasone sodium phosphate Disposition: Patient tolerated procedure well. Injection site dressed with a band-aid.  Return in about 3 weeks (around 05/10/2021) for Plantar fasciitis.

## 2021-04-19 NOTE — Patient Instructions (Signed)

## 2021-04-23 ENCOUNTER — Ambulatory Visit: Payer: BC Managed Care – PPO | Admitting: Podiatry

## 2021-05-02 DIAGNOSIS — M1611 Unilateral primary osteoarthritis, right hip: Secondary | ICD-10-CM | POA: Diagnosis not present

## 2021-05-10 ENCOUNTER — Other Ambulatory Visit: Payer: Self-pay

## 2021-05-10 ENCOUNTER — Encounter: Payer: Self-pay | Admitting: Podiatry

## 2021-05-10 ENCOUNTER — Ambulatory Visit (INDEPENDENT_AMBULATORY_CARE_PROVIDER_SITE_OTHER): Payer: BC Managed Care – PPO | Admitting: Podiatry

## 2021-05-10 DIAGNOSIS — M722 Plantar fascial fibromatosis: Secondary | ICD-10-CM | POA: Diagnosis not present

## 2021-05-10 DIAGNOSIS — M216X9 Other acquired deformities of unspecified foot: Secondary | ICD-10-CM | POA: Diagnosis not present

## 2021-05-10 MED ORDER — MELOXICAM 15 MG PO TABS: 15.0000 mg | ORAL_TABLET | Freq: Every day | ORAL | 0 refills | Status: DC

## 2021-05-10 NOTE — Progress Notes (Signed)
  Subjective:  Patient ID: JAHAAD REPPERT, male    DOB: 07-10-1966,  MRN: [id number]  Chief Complaint  Patient presents with   Plantar Fasciitis    The brace hurts and the injection does help and I can walk with out it hurting on the left foot    55 y.o. male presents with the above complaint. History confirmed with patient. Objective:  Physical Exam: warm, good capillary refill, no trophic changes or ulcerative lesions, normal DP and PT pulses, and normal sensory exam. Left Foot: tenderness to palpation medial calcaneal tuber, no pain with calcaneal squeeze, decreased ankle joint ROM, and +Silverskiold test  normal exam, no swelling, tenderness, instability; ligaments intact, full range of motion of all ankle/foot joints other than findings noted above.  Assessment:   1. Plantar fasciitis of left foot   2. Equinus deformity of foot    Plan:  Patient was evaluated and treated and all questions answered.  Plantar Fasciitis -Much improved.  No point tenderness today.  Hold off on injection.  Rx meloxicam for residual inflammation relief.  Follow-up should issues persist  No follow-ups on file.

## 2021-05-14 ENCOUNTER — Encounter: Payer: Self-pay | Admitting: Cardiology

## 2021-05-14 ENCOUNTER — Other Ambulatory Visit: Payer: Self-pay

## 2021-05-14 ENCOUNTER — Other Ambulatory Visit: Payer: Self-pay | Admitting: *Deleted

## 2021-05-14 ENCOUNTER — Ambulatory Visit (INDEPENDENT_AMBULATORY_CARE_PROVIDER_SITE_OTHER): Payer: BC Managed Care – PPO | Admitting: Cardiology

## 2021-05-14 VITALS — BP 132/76 | HR 76 | Ht 73.0 in | Wt 260.0 lb

## 2021-05-14 DIAGNOSIS — Z01812 Encounter for preprocedural laboratory examination: Secondary | ICD-10-CM

## 2021-05-14 DIAGNOSIS — Z01818 Encounter for other preprocedural examination: Secondary | ICD-10-CM

## 2021-05-14 DIAGNOSIS — I480 Paroxysmal atrial fibrillation: Secondary | ICD-10-CM

## 2021-05-14 MED ORDER — APIXABAN 5 MG PO TABS: 5.0000 mg | ORAL_TABLET | Freq: Two times a day (BID) | ORAL | 6 refills | Status: DC

## 2021-05-14 NOTE — Progress Notes (Signed)
Electrophysiology Office Note   Date:  05/14/2021   ID:  Jon Vasquez 05-06-1966, MRN [id number]  PCP:  Angelina Sheriff, MD  Cardiologist:  Jon Vasquez Primary Electrophysiologist:  Jon Mcqueary Meredith Leeds, MD    Chief Complaint: AF   History of Present Illness: Jon Vasquez is a 55 y.o. male who is being seen today for the evaluation of AF at the request of Jon Vasquez, Hilton Cork, MD. Presenting today for electrophysiology evaluation.  He has a history significant for atrial fibrillation.  He was seen 02/13/2021 with palpitations atrial fibrillation.  He wore a cardiac monitor that showed a 2% atrial fibrillation burden.  Today, he denies symptoms of palpitations, chest pain, shortness of breath, orthopnea, PND, lower extremity edema, claudication, dizziness, presyncope, syncope, bleeding, or neurologic sequela. The patient is tolerating medications without difficulties.  When he is in sinus rhythm he feels well.  He does have weakness, fatigue, and shortness of breath when he is in atrial fibrillation.  He wore a cardiac monitor that shows rapid atrial fibrillation.  He feels that this occurs when he works too hard.  Aside from that, he feels well and is wanting to exercise and lose weight.  Of note, his girlfriend does not feel that he snores loudly or has apneic events.   Past Medical History:  Diagnosis Date   Arthritis    Bronchitis    Diverticulitis    GERD (gastroesophageal reflux disease)    occasional    Pneumonia    Past Surgical History:  Procedure Laterality Date   APPENDECTOMY     COLONOSCOPY     POLYPECTOMY       Current Outpatient Medications  Medication Sig Dispense Refill   [START ON 06/04/2021] apixaban (ELIQUIS) 5 MG TABS tablet Take 1 tablet (5 mg total) by mouth 2 (two) times daily. 60 tablet 6   cholecalciferol (VITAMIN D3) 25 MCG (1000 UNIT) tablet Take 1,000 Units by mouth daily.     diclofenac (VOLTAREN) 75 MG EC tablet Take 75 mg by mouth 2 (two)  times daily.     ELDERBERRY PO Take 1 tablet by mouth daily.     metoprolol tartrate (LOPRESSOR) 25 MG tablet Take 1 tablet (25 mg total) by mouth 2 (two) times daily. 180 tablet 3   Multiple Vitamin (MULTIVITAMIN) tablet Take 1 tablet by mouth daily.     naproxen sodium (ALEVE) 220 MG tablet Take 440 mg by mouth 2 (two) times daily as needed for pain (PAIN).     nitroGLYCERIN (NITROSTAT) 0.4 MG SL tablet Place 1 tablet under the tongue every 5 (five) minutes x 3 doses as needed for chest pain.     Omega-3 Fatty Acids (OMEGA 3 PO) Take 1 capsule by mouth daily.     Probiotic Product (PROBIOTIC PO) Take 1 capsule by mouth daily.     zinc gluconate 50 MG tablet Take 1 tablet by mouth daily.     meloxicam (MOBIC) 15 MG tablet Take 1 tablet (15 mg total) by mouth daily. (Patient not taking: Reported on 05/14/2021) 30 tablet 0   No current facility-administered medications for this visit.    Allergies:   Patient has no known allergies.   Social History:  The patient  reports that he has never smoked. He has never used smokeless tobacco. He reports that he does not drink alcohol and does not use drugs.   Family History:  The patient's family history includes Cancer in his mother; Colon polyps in  his mother; Diabetes in his mother; Heart disease in his father; Lung cancer in his mother; Pulmonary fibrosis in his father.    ROS:  Please see the history of present illness.   Otherwise, review of systems is positive for none.   All other systems are reviewed and negative.    PHYSICAL EXAM: VS:  BP 132/76   Pulse 76   Ht '6\' 1"'$  (1.854 m)   Wt 260 lb (117.9 kg)   SpO2 97%   BMI 34.30 kg/m  , BMI Body mass index is 34.3 kg/m. GEN: Well nourished, well developed, in no acute distress  HEENT: normal  Neck: no JVD, carotid bruits, or masses Cardiac: RRR; no murmurs, rubs, or gallops,no edema  Respiratory:  clear to auscultation bilaterally, normal work of breathing GI: soft, nontender,  nondistended, + BS MS: no deformity or atrophy  Skin: warm and dry Neuro:  Strength and sensation are intact Psych: euthymic mood, full affect  EKG:  EKG is ordered today. Personal review of the ekg ordered shows sinus rhythm  Recent Labs: No results found for requested labs within last 8760 hours.    Lipid Panel  No results found for: CHOL, TRIG, HDL, CHOLHDL, VLDL, LDLCALC, LDLDIRECT   Wt Readings from Last 3 Encounters:  05/14/21 260 lb (117.9 kg)  04/06/21 268 lb 9.6 oz (121.8 kg)  02/22/21 265 lb (120.2 kg)      Other studies Reviewed: Additional studies/ records that were reviewed today include: Myoview 02/22/2021 Review of the above records today demonstrates:  The left ventricular ejection fraction is normal (55-65%). Nuclear stress EF: 64%. There was no ST segment deviation noted during stress. No T wave inversion was noted during stress. The study is normal. This is a low risk study.  Cardiac monitor 02/27/2021 personally reviewed Atrial fibrillation was present with an A. fib burden of 2%.  There were 61 episodes 3 greater than 6 minutes the longest 47 minutes average heart rate of 160 bpm.  ASSESSMENT AND PLAN:  1.  Paroxysmal atrial fibrillation: Recent cardiac monitor with a 2% burden.Currently on metoprolol.  CHA2DS2-VASc of 0.  At this point, he would like to stay in normal rhythm.  We discussed medications versus ablation.  He would like to avoid medications if at all possible.  We Marki Frede plan for ablation.  We Danyiel Crespin start prior to ablation.  Risk, benefits, and alternatives to EP study and radiofrequency ablation for afib were also discussed in detail today. These risks include but are not limited to stroke, bleeding, vascular damage, tamponade, perforation, damage to the esophagus, lungs, and other structures, pulmonary vein stenosis, worsening renal function, and death. The patient understands these risk and wishes to proceed.  We Giavanni Zeitlin therefore proceed with  catheter ablation at the next available time.  Carto, ICE, anesthesia are requested for the procedure.  Malaky Tetrault also obtain CT PV protocol prior to the procedure to exclude LAA thrombus and further evaluate atrial anatomy.   2.  Obesity: Diet and exercise encouraged  Case discussed with primary cardiology  Current medicines are reviewed at length with the patient today.   The patient does not have concerns regarding his medicines.  The following changes were made today: Start Eliquis prior to ablation  Labs/ tests ordered today include:  Orders Placed This Encounter  Procedures   CT CARDIAC MORPH/PULM VEIN W/CM&W/O CA SCORE   Basic metabolic panel   CBC   EKG 12-Lead     Disposition:   FU with Aleysha Meckler  Delvonte Berenson 3 months  Signed, Savannah Morford Meredith Leeds, MD  05/14/2021 12:30 PM     [place] [address] [phone] (office) [phone] (fax)

## 2021-05-14 NOTE — Patient Instructions (Addendum)
Medication Instructions:  Your physician recommends that you continue on your current medications as directed. Please refer to the Current Medication list given to you today.  *If you need a refill on your cardiac medications before your next appointment, please call your pharmacy*   Lab Work: Pre procedure labs between:  BMP & CBC.  You do NOT need to be fasting.  You can stop by the [place] office anytime between   (avoid lunch time hours).   If you have labs (blood work) drawn today and your tests are completely normal, you will receive your results only by: [place] (if you have MyChart) OR A paper copy in the mail If you have any lab test that is abnormal or we need to change your treatment, we will call you to review the results.   Testing/Procedures: Your physician has requested that you have cardiac CT within 7 days PRIOR to your ablation. Cardiac computed tomography (CT) is a painless test that uses an x-ray machine to take clear, detailed pictures of your heart.  Please follow instruction below located under "other instructions". You will get a call from our office to schedule the date for this test.  Your physician has recommended that you have an ablation. Catheter ablation is a medical procedure used to treat some cardiac arrhythmias (irregular heartbeats). During catheter ablation, a long, thin, flexible tube is put into a blood vessel in your groin (upper thigh), or neck. This tube is called an ablation catheter. It is then guided to your heart through the blood vessel. Radio frequency waves destroy small areas of heart tissue where abnormal heartbeats may cause an arrhythmia to start. Please follow instruction below located under "other instructions".   Follow-Up: At [facility], you and your health needs are our priority.  As part of our continuing mission to provide you with exceptional heart care, we have created designated Provider Care Teams.  These Care Teams  include your primary Cardiologist (physician) and Advanced Practice Providers (APPs -  Physician Assistants and Nurse Practitioners) who all work together to provide you with the care you need, when you need it.  We recommend signing up for the patient portal called "MyChart".  Sign up information is provided on this After Visit Summary.  MyChart is used to connect with patients for Virtual Visits (Telemedicine).  Patients are able to view lab/test results, encounter notes, upcoming appointments, etc.  Non-urgent messages can be sent to your provider as well.   To learn more about what you can do with MyChart, go to [email or web address].    Your next appointment:   1 month(s) after your ablation  The format for your next appointment:   In Person  Provider:   AFib clinic   Thank you for choosing [facility]!!   Trinidad Curet, RN [phone]    Other Instructions  CT INSTRUCTIONS Your cardiac CT will be scheduled at:  [facility] [address] [place], [place] [address] [phone]  Please arrive at the [facility] main entrance of [facility] 30 minutes prior to test start time. Proceed to the [facility] Radiology Department (first floor) to check-in and test prep.   Please follow these instructions carefully (unless otherwise directed):  Hold all erectile dysfunction medications at least 3 days (72 hrs) prior to test.  On the Night Before the Test: Be sure to Drink plenty of water. Do not consume any caffeinated/decaffeinated beverages or chocolate 12 hours prior to your test. Do  not take any antihistamines 12 hours prior to your test.  On the Day of the Test: Drink plenty of water until 1 hour prior to the test. Do not eat any food 4 hours prior to the test. You may take your regular medications prior to the test.  Take metoprolol (Lopressor) two hours prior to test. HOLD Furosemide/Hydrochlorothiazide morning of the test.  After  the Test: Drink plenty of water. After receiving IV contrast, you may experience a mild flushed feeling. This is normal. On occasion, you may experience a mild rash up to 24 hours after the test. This is not dangerous. If this occurs, you can take Benadryl 25 mg and increase your fluid intake. If you experience trouble breathing, this can be serious. If it is severe call 911 IMMEDIATELY. If it is mild, please call our office. If you take any of these medications: Glipizide/Metformin, Avandament, Glucavance, please do not take 48 hours after completing test unless otherwise instructed.   Once we have confirmed authorization from your insurance company, we will call you to set up a date and time for your test. Based on how quickly your insurance processes prior authorizations requests, please allow up to 4 weeks to be contacted for scheduling your Cardiac CT appointment. Be advised that routine Cardiac CT appointments could be scheduled as many as 8 weeks after your provider has ordered it.  For non-scheduling related questions, please contact the cardiac imaging nurse navigator should you have any questions/concerns: [name], Cardiac Imaging Nurse Navigator Gordy Clement, Cardiac Imaging Nurse Navigator [place] Direct Office Dial: [phone]       Electrophysiology/Ablation Procedure Instructions   You are scheduled for a(n)  ablation on 06/27/2021 with Dr. Allegra Lai.   1.   Pre procedure testing-             A.  LAB WORK ---  between 9/12 - 9/23:  BMP & CBC.  You do NOT need to be fasting.  You can stop by the [place] office anytime between those dates (avoid lunch time hours).    On the day of your procedure 06/27/2021 you will go to [facility] [phone]) at 6:30 am.  Dennis Bast will go to the main entrance A [employer]) and enter where the [employer] are.  Your driver will drop you off and you will head down the hallway to  ADMITTING.  You may have one support person come in to the hospital with you.  They will be asked to wait in the waiting room. It is OK to have someone drop you off and come back when you are ready to be discharged.   3.   Do not eat or drink after midnight prior to your procedure.   4.   On the morning of your procedure do NOT take any medication. Do not miss any doses of your blood thinner prior to the morning of your procedure or your procedure will need to be rescheduled.   5.  Plan for an overnight stay but you may be discharged after your procedure, if you use your phone frequently bring your phone charger. If you are discharged after your procedure you will need someone to drive you home and be with you for 24 hours after your procedure.   6. You will follow up with the AFIB clinic 4 weeks after your procedure.  You will follow up with Dr. Curt Bears  3 months after your procedure.  These appointments will be made for you.   7. FYI: For your safety, and to allow [place] to monitor your vital signs accurately during the surgery/procedure we request that if you have artificial nails, gel coating, SNS etc. Please have those removed prior to your surgery/procedure. Not having the nail coverings /polish removed may result in cancellation or delay of your surgery/procedure.  * If you have ANY questions please call the office [phone] and ask for Obert Espindola RN or send me a MyChart message   * Occasionally, EP Studies and ablations can become lengthy.  Please make your family aware of this before your procedure starts.  Average time ranges from 2-8 hours for EP studies/ablations.  Your physician will call your family after the procedure with the results.                                    Cardiac Ablation Cardiac ablation is a procedure to destroy (ablate) some heart tissue that is sending bad signals. These bad signals causeproblems in heart rhythm. The heart has many areas that make these signals. If  there are problems in these areas, they can make the heart beat in a way that is not normal.Destroying some tissues can help make the heart rhythm normal. Tell your doctor about: Any allergies you have. All medicines you are taking. These include vitamins, herbs, eye drops, creams, and over-the-counter medicines. Any problems you or family members have had with medicines that make you fall asleep (anesthetics). Any blood disorders you have. Any surgeries you have had. Any medical conditions you have, such as kidney failure. Whether you are pregnant or may be pregnant. What are the risks? This is a safe procedure. But problems may occur, including: Infection. Bruising and bleeding. Bleeding into the chest. Stroke or blood clots. Damage to nearby areas of your body. Allergies to medicines or dyes. The need for a pacemaker if the normal system is damaged. Failure of the procedure to treat the problem. What happens before the procedure? Medicines Ask your doctor about: Changing or stopping your normal medicines. This is important. Taking aspirin and ibuprofen. Do not take these medicines unless your doctor tells you to take them. Taking other medicines, vitamins, herbs, and supplements. General instructions Follow instructions from your doctor about what you cannot eat or drink. Plan to have someone take you home from the hospital or clinic. If you will be going home right after the procedure, plan to have someone with you for 24 hours. Ask your doctor what steps will be taken to prevent infection. What happens during the procedure?  An IV tube will be put into one of your veins. You will be given a medicine to help you relax. The skin on your neck or groin will be numbed. A cut (incision) will be made in your neck or groin. A needle will be put through your cut and into a large vein. A tube (catheter) will be put into the needle. The tube will be moved to your heart. Dye may be put  through the tube. This helps your doctor see your heart. Small devices (electrodes) on the tube will send out signals. A type of energy will be used to destroy some heart tissue. The tube will be taken out. Pressure will be held on your cut. This helps stop bleeding. A bandage will be put over your cut. The exact procedure may vary among  doctors and hospitals. What happens after the procedure? You will be watched until you leave the hospital or clinic. This includes checking your heart rate, breathing rate, oxygen, and blood pressure. Your cut will be watched for bleeding. You will need to lie still for a few hours. Do not drive for 24 hours or as long as your doctor tells you. Summary Cardiac ablation is a procedure to destroy some heart tissue. This is done to treat heart rhythm problems. Tell your doctor about any medical conditions you may have. Tell him or her about all medicines you are taking to treat them. This is a safe procedure. But problems may occur. These include infection, bruising, bleeding, and damage to nearby areas of your body. Follow what your doctor tells you about food and drink. You may also be told to change or stop some of your medicines. After the procedure, do not drive for 24 hours or as long as your doctor tells you. This information is not intended to replace advice given to you by your health care provider. Make sure you discuss any questions you have with your healthcare provider. Document Revised: 08/12/2019 Document Reviewed: 08/12/2019 Elsevier Patient Education  2022 [employer].

## 2021-06-06 ENCOUNTER — Other Ambulatory Visit: Payer: Self-pay | Admitting: Podiatry

## 2021-06-15 ENCOUNTER — Telehealth (HOSPITAL_COMMUNITY): Payer: Self-pay | Admitting: Emergency Medicine

## 2021-06-15 NOTE — Telephone Encounter (Signed)
Pt returning phone call regarding upcoming cardiac imaging study; pt verbalizes understanding of appt date/time, parking situation and where to check in, pre-test NPO status and medications ordered, and verified current allergies; name and call back number provided for further questions should they arise Jon Bond RN Navigator Cardiac Imaging [facility] [phone] office [phone] cell  Reminded patient to get labs today or Monday Asked patient to take 50mg  metoprolol tart 2 hr prior to scan Jon Vasquez

## 2021-06-15 NOTE — Telephone Encounter (Signed)
Attempted to call patient regarding upcoming cardiac CT appointment. Left message on voicemail with name and callback number [name] RN Navigator Cardiac Imaging [facility] [phone] Office [phone] Cell  Courtesy call to remind of lab draw

## 2021-06-18 DIAGNOSIS — Z01812 Encounter for preprocedural laboratory examination: Secondary | ICD-10-CM | POA: Diagnosis not present

## 2021-06-18 DIAGNOSIS — I480 Paroxysmal atrial fibrillation: Secondary | ICD-10-CM | POA: Diagnosis not present

## 2021-06-20 ENCOUNTER — Other Ambulatory Visit: Payer: Self-pay

## 2021-06-20 ENCOUNTER — Telehealth: Payer: Self-pay | Admitting: Cardiology

## 2021-06-20 ENCOUNTER — Ambulatory Visit (HOSPITAL_COMMUNITY)
Admission: RE | Admit: 2021-06-20 | Discharge: 2021-06-20 | Disposition: A | Discharge status: Home / Self Care | Payer: BC Managed Care – PPO | Source: Ambulatory Visit | Attending: Cardiology | Admitting: Cardiology

## 2021-06-20 DIAGNOSIS — R9389 Abnormal findings on diagnostic imaging of other specified body structures: Secondary | ICD-10-CM

## 2021-06-20 DIAGNOSIS — I480 Paroxysmal atrial fibrillation: Secondary | ICD-10-CM | POA: Insufficient documentation

## 2021-06-20 LAB — BASIC METABOLIC PANEL
BUN/Creatinine Ratio: 15 (ref 9–20)
BUN: 16 mg/dL (ref 6–24)
CO2: 21 mmol/L (ref 20–29)
Calcium: 8.4 mg/dL — ABNORMAL LOW (ref 8.7–10.2)
Chloride: 103 mmol/L (ref 96–106)
Creatinine, Ser: 1.05 mg/dL (ref 0.76–1.27)
Glucose: 115 mg/dL — ABNORMAL HIGH (ref 70–99)
Potassium: 3.9 mmol/L (ref 3.5–5.2)
Sodium: 140 mmol/L (ref 134–144)
eGFR: 83 mL/min/{1.73_m2} (ref >59)

## 2021-06-20 LAB — CBC
Hematocrit: 36.8 % — ABNORMAL LOW (ref 37.5–51.0)
Hemoglobin: 12.7 g/dL — ABNORMAL LOW (ref 13.0–17.7)
MCH: 31.4 pg (ref 26.6–33.0)
MCHC: 34.5 g/dL (ref 31.5–35.7)
MCV: 91 fL (ref 79–97)
Platelets: 198 10*3/uL (ref 150–450)
RBC: 4.04 x10E6/uL — ABNORMAL LOW (ref 4.14–5.80)
RDW: 13.3 % (ref 11.6–15.4)
WBC: 5.4 10*3/uL (ref 3.4–10.8)

## 2021-06-20 MED ORDER — IOHEXOL 350 MG/ML SOLN
80.0000 mL | Freq: Once | INTRAVENOUS | Status: AC | PRN
  Administered 2021-06-20: 80 mL via INTRAVENOUS

## 2021-06-20 NOTE — Telephone Encounter (Signed)
[facility] Radiology called with a call report for this patient

## 2021-06-20 NOTE — Telephone Encounter (Signed)
Radiology calling in report --  Concerning for bilateral PEs...Marland KitchenMarland KitchenMarland Kitchen AFib ablation scheduled for 10/5. Forwarding to MD for advisement.

## 2021-06-20 NOTE — Telephone Encounter (Signed)
Patient states he is returning a call to go over his CT results.

## 2021-06-20 NOTE — Telephone Encounter (Signed)
Returned pt call. Pt informed of CT result:  Examination was not specifically tailored to evaluate for pulmonary embolism, and with this limitation in mind, there are findings concerning for potential pulmonary emboli in the lungs bilaterally. If there is clinical suspicion for pulmonary embolism, further evaluation with PE protocol CT scan would be recommended at this time.    Pt aware office will call to arrange CT this week if possible after clearance from insurance. Aware that ablation will be cancelled if PE confirmed. Patient verbalized understanding and agreeable to plan.

## 2021-06-21 ENCOUNTER — Telehealth: Payer: Self-pay | Admitting: Cardiology

## 2021-06-21 NOTE — Telephone Encounter (Signed)
Pt aware office will call him today to arrange CT for tomorrow morning in our office. Informed did not get approval until after 5 pm yesterday. Patient verbalized understanding and agreeable to plan.

## 2021-06-21 NOTE — Telephone Encounter (Signed)
calling for an update on whether or not his upcoming procedure would need to be rescheduled... are you available?

## 2021-06-22 ENCOUNTER — Inpatient Hospital Stay: Admission: RE | Admit: 2021-06-22 | Payer: BC Managed Care – PPO | Source: Ambulatory Visit

## 2021-06-22 ENCOUNTER — Ambulatory Visit (INDEPENDENT_AMBULATORY_CARE_PROVIDER_SITE_OTHER)
Admission: RE | Admit: 2021-06-22 | Discharge: 2021-06-22 | Disposition: A | Discharge status: Home / Self Care | Payer: BC Managed Care – PPO | Source: Ambulatory Visit | Attending: Cardiology | Admitting: Cardiology

## 2021-06-22 ENCOUNTER — Other Ambulatory Visit: Payer: Self-pay

## 2021-06-22 DIAGNOSIS — R590 Localized enlarged lymph nodes: Secondary | ICD-10-CM | POA: Diagnosis not present

## 2021-06-22 DIAGNOSIS — R9389 Abnormal findings on diagnostic imaging of other specified body structures: Secondary | ICD-10-CM

## 2021-06-22 MED ORDER — IOHEXOL 350 MG/ML SOLN
80.0000 mL | Freq: Once | INTRAVENOUS | Status: AC | PRN
  Administered 2021-06-22: 80 mL via INTRAVENOUS

## 2021-06-26 NOTE — Anesthesia Preprocedure Evaluation (Addendum)
Anesthesia Evaluation  Patient identified by MRN, date of birth, ID band Patient awake    Reviewed: Allergy & Precautions, NPO status , Patient's Chart, lab work & pertinent test results, reviewed documented beta blocker date and time   Airway Mallampati: II  TM Distance: >3 FB Neck ROM: Full    Dental  (+) Teeth Intact, Dental Advisory Given   Pulmonary neg pulmonary ROS,    Pulmonary exam normal breath sounds clear to auscultation       Cardiovascular hypertension, Pt. on home beta blockers (-) anginaNormal cardiovascular exam+ dysrhythmias Atrial Fibrillation  Rhythm:Regular Rate:Normal  Stress Test 02/22/21: -The left ventricular ejection fraction is normal (55-65%). -Nuclear stress EF: 64%. -There was no ST segment deviation noted during stress. -No T wave inversion was noted during stress. -The study is normal. -This is a low risk study.    Neuro/Psych negative neurological ROS  negative psych ROS   GI/Hepatic Neg liver ROS, GERD  ,  Endo/Other  Obesity   Renal/GU negative Renal ROS     Musculoskeletal  (+) Arthritis ,   Abdominal   Peds  Hematology  (+) Blood dyscrasia (Eliquis), ,   Anesthesia Other Findings   Reproductive/Obstetrics                            Anesthesia Physical Anesthesia Plan  ASA: 3  Anesthesia Plan: General   Post-op Pain Management:    Induction: Intravenous  PONV Risk Score and Plan: 2 and Midazolam, Dexamethasone and Ondansetron  Airway Management Planned: Oral ETT  Additional Equipment:   Intra-op Plan:   Post-operative Plan: Extubation in OR  Informed Consent: I have reviewed the patients History and Physical, chart, labs and discussed the procedure including the risks, benefits and alternatives for the proposed anesthesia with the patient or authorized representative who has indicated his/her understanding and acceptance.     Dental  advisory given  Plan Discussed with: CRNA  Anesthesia Plan Comments:        Anesthesia Quick Evaluation

## 2021-06-26 NOTE — Pre-Procedure Instructions (Signed)
Attempted to call patient regarding procedure instructions .  Left voice mail on the following items: Arrival time 0615-0630 Nothing to eat or drink after midnight No meds AM of procedure Responsible person to drive you home and stay with you for 24 hrs  Have you missed any doses of anti-coagulant Eliquis- take both doses today, don't take any in the morning

## 2021-06-27 ENCOUNTER — Encounter (HOSPITAL_COMMUNITY)
Admission: RE | Disposition: A | Discharge status: Home / Self Care | Payer: Self-pay | Source: Home / Self Care | Attending: Cardiology

## 2021-06-27 ENCOUNTER — Other Ambulatory Visit: Payer: Self-pay

## 2021-06-27 ENCOUNTER — Ambulatory Visit (HOSPITAL_COMMUNITY)
Admission: RE | Admit: 2021-06-27 | Discharge: 2021-06-27 | Disposition: A | Discharge status: Home / Self Care | Payer: BC Managed Care – PPO | Attending: Cardiology | Admitting: Cardiology

## 2021-06-27 ENCOUNTER — Ambulatory Visit (HOSPITAL_COMMUNITY): Payer: BC Managed Care – PPO | Admitting: Anesthesiology

## 2021-06-27 DIAGNOSIS — J40 Bronchitis, not specified as acute or chronic: Secondary | ICD-10-CM | POA: Diagnosis not present

## 2021-06-27 DIAGNOSIS — I480 Paroxysmal atrial fibrillation: Secondary | ICD-10-CM | POA: Insufficient documentation

## 2021-06-27 DIAGNOSIS — I4891 Unspecified atrial fibrillation: Secondary | ICD-10-CM | POA: Diagnosis not present

## 2021-06-27 DIAGNOSIS — R000 Tachycardia, unspecified: Secondary | ICD-10-CM | POA: Diagnosis not present

## 2021-06-27 DIAGNOSIS — M1990 Unspecified osteoarthritis, unspecified site: Secondary | ICD-10-CM | POA: Diagnosis not present

## 2021-06-27 DIAGNOSIS — K5792 Diverticulitis of intestine, part unspecified, without perforation or abscess without bleeding: Secondary | ICD-10-CM | POA: Diagnosis not present

## 2021-06-27 HISTORY — PX: ATRIAL FIBRILLATION ABLATION: EP1191

## 2021-06-27 LAB — POCT ACTIVATED CLOTTING TIME
Activated Clotting Time: 242 seconds
Activated Clotting Time: 277 seconds
Activated Clotting Time: 306 seconds
Activated Clotting Time: 329 seconds

## 2021-06-27 SURGERY — ATRIAL FIBRILLATION ABLATION
Anesthesia: General

## 2021-06-27 MED ORDER — ONDANSETRON HCL 4 MG/2ML IJ SOLN
INTRAMUSCULAR | Status: DC | PRN
  Administered 2021-06-27: 4 mg via INTRAVENOUS

## 2021-06-27 MED ORDER — FENTANYL CITRATE (PF) 250 MCG/5ML IJ SOLN
INTRAMUSCULAR | Status: DC | PRN
  Administered 2021-06-27 (×4): 50 ug via INTRAVENOUS

## 2021-06-27 MED ORDER — LIDOCAINE 2% (20 MG/ML) 5 ML SYRINGE
INTRAMUSCULAR | Status: DC | PRN
  Administered 2021-06-27: 100 mg via INTRAVENOUS

## 2021-06-27 MED ORDER — DOBUTAMINE INFUSION FOR EP/ECHO/NUC (1000 MCG/ML)
INTRAVENOUS | Status: DC | PRN
  Administered 2021-06-27: 20 ug/kg/min via INTRAVENOUS

## 2021-06-27 MED ORDER — HEPARIN SODIUM (PORCINE) 1000 UNIT/ML IJ SOLN
INTRAMUSCULAR | Status: DC | PRN
  Administered 2021-06-27: 1000 [IU] via INTRAVENOUS

## 2021-06-27 MED ORDER — SUGAMMADEX SODIUM 200 MG/2ML IV SOLN
INTRAVENOUS | Status: DC | PRN
  Administered 2021-06-27: 300 mg via INTRAVENOUS

## 2021-06-27 MED ORDER — ACETAMINOPHEN 500 MG PO TABS
ORAL_TABLET | ORAL | Status: AC
  Administered 2021-06-27: 1000 mg via ORAL
  Filled 2021-06-27: qty 2

## 2021-06-27 MED ORDER — SODIUM CHLORIDE 0.9% FLUSH: 3.0000 mL | Freq: Two times a day (BID) | INTRAVENOUS | Status: DC

## 2021-06-27 MED ORDER — MIDAZOLAM HCL 5 MG/5ML IJ SOLN
INTRAMUSCULAR | Status: DC | PRN
  Administered 2021-06-27: 2 mg via INTRAVENOUS

## 2021-06-27 MED ORDER — SODIUM CHLORIDE 0.9 % IV SOLN: INTRAVENOUS | Status: DC

## 2021-06-27 MED ORDER — ACETAMINOPHEN 500 MG PO TABS: 1000.0000 mg | ORAL_TABLET | Freq: Once | ORAL | Status: AC

## 2021-06-27 MED ORDER — ROCURONIUM BROMIDE 10 MG/ML (PF) SYRINGE
PREFILLED_SYRINGE | INTRAVENOUS | Status: DC | PRN
Start: 2021-06-27 — End: 2021-06-27
  Administered 2021-06-27: 20 mg via INTRAVENOUS
  Administered 2021-06-27: 60 mg via INTRAVENOUS
  Administered 2021-06-27: 20 mg via INTRAVENOUS

## 2021-06-27 MED ORDER — PROPOFOL 10 MG/ML IV BOLUS
INTRAVENOUS | Status: DC | PRN
Start: 2021-06-27 — End: 2021-06-27
  Administered 2021-06-27: 150 mg via INTRAVENOUS

## 2021-06-27 MED ORDER — DEXAMETHASONE SODIUM PHOSPHATE 10 MG/ML IJ SOLN
INTRAMUSCULAR | Status: DC | PRN
  Administered 2021-06-27: 10 mg via INTRAVENOUS

## 2021-06-27 MED ORDER — DOBUTAMINE INFUSION FOR EP/ECHO/NUC (1000 MCG/ML)
INTRAVENOUS | Status: AC
  Filled 2021-06-27: qty 250

## 2021-06-27 MED ORDER — HEPARIN SODIUM (PORCINE) 1000 UNIT/ML IJ SOLN
INTRAMUSCULAR | Status: DC | PRN
  Administered 2021-06-27 (×2): 6000 [IU] via INTRAVENOUS
  Administered 2021-06-27: 15000 [IU] via INTRAVENOUS
  Administered 2021-06-27: 2000 [IU] via INTRAVENOUS

## 2021-06-27 MED ORDER — PHENYLEPHRINE 40 MCG/ML (10ML) SYRINGE FOR IV PUSH (FOR BLOOD PRESSURE SUPPORT)
PREFILLED_SYRINGE | INTRAVENOUS | Status: DC | PRN
  Administered 2021-06-27: 40 ug via INTRAVENOUS

## 2021-06-27 MED ORDER — ONDANSETRON HCL 4 MG/2ML IJ SOLN: 4.0000 mg | Freq: Four times a day (QID) | INTRAMUSCULAR | Status: DC | PRN

## 2021-06-27 MED ORDER — HEPARIN (PORCINE) IN NACL 1000-0.9 UT/500ML-% IV SOLN
INTRAVENOUS | Status: DC | PRN
  Administered 2021-06-27 (×2): 500 mL

## 2021-06-27 MED ORDER — SODIUM CHLORIDE 0.9 % IV SOLN: 250.0000 mL | INTRAVENOUS | Status: DC | PRN

## 2021-06-27 MED ORDER — ACETAMINOPHEN 325 MG PO TABS: 650.0000 mg | ORAL_TABLET | ORAL | Status: DC | PRN

## 2021-06-27 MED ORDER — HEPARIN SODIUM (PORCINE) 1000 UNIT/ML IJ SOLN
INTRAMUSCULAR | Status: AC
  Filled 2021-06-27: qty 1

## 2021-06-27 MED ORDER — PHENYLEPHRINE HCL-NACL 20-0.9 MG/250ML-% IV SOLN
INTRAVENOUS | Status: DC | PRN
  Administered 2021-06-27: 40 ug/min via INTRAVENOUS

## 2021-06-27 MED ORDER — HEPARIN (PORCINE) IN NACL 2000-0.9 UNIT/L-% IV SOLN
INTRAVENOUS | Status: DC | PRN
  Administered 2021-06-27 (×2): 1000 mL

## 2021-06-27 MED ORDER — LACTATED RINGERS IV SOLN: INTRAVENOUS | Status: DC | PRN

## 2021-06-27 MED ORDER — SODIUM CHLORIDE 0.9% FLUSH: 3.0000 mL | INTRAVENOUS | Status: DC | PRN

## 2021-06-27 SURGICAL SUPPLY — 22 items
BAG SNAP BAND KOVER 36X36 (MISCELLANEOUS) ×2 IMPLANT
BLANKET WARM UNDERBOD FULL ACC (MISCELLANEOUS) ×3 IMPLANT
CATH OCTARAY 2.0 F 3-3-3-3-3 (CATHETERS) ×2 IMPLANT
CATH S CIRCA THERM PROBE 10F (CATHETERS) ×2 IMPLANT
CATH SMTCH THERMOCOOL SF DF (CATHETERS) ×2 IMPLANT
CATH SOUNDSTAR ECO 8FR (CATHETERS) ×2 IMPLANT
CATH WEB BI DIR CSDF CRV REPRO (CATHETERS) ×2 IMPLANT
CLOSURE PERCLOSE PROSTYLE (VASCULAR PRODUCTS) ×8 IMPLANT
COVER SWIFTLINK CONNECTOR (BAG) ×3 IMPLANT
KIT VERSACROSS STEERABLE D1 (CATHETERS) ×2 IMPLANT
MAT PREVALON FULL STRYKER (MISCELLANEOUS) ×2 IMPLANT
PACK EP LATEX FREE (CUSTOM PROCEDURE TRAY) ×3
PACK EP LF (CUSTOM PROCEDURE TRAY) ×1 IMPLANT
PAD PRO RADIOLUCENT 2001M-C (PAD) ×3 IMPLANT
PATCH CARTO3 (PAD) ×2 IMPLANT
SHEATH BAYLIS TRANSSEPTAL 98CM (NEEDLE) ×2 IMPLANT
SHEATH CARTO VIZIGO SM CVD (SHEATH) ×4 IMPLANT
SHEATH PINNACLE 7F 10CM (SHEATH) ×2 IMPLANT
SHEATH PINNACLE 8F 10CM (SHEATH) ×4 IMPLANT
SHEATH PINNACLE 9F 10CM (SHEATH) ×2 IMPLANT
SHEATH PROBE COVER 6X72 (BAG) ×2 IMPLANT
TUBING SMART ABLATE COOLFLOW (TUBING) ×2 IMPLANT

## 2021-06-27 NOTE — Transfer of Care (Signed)
Immediate Anesthesia Transfer of Care Note  Patient: Jon Vasquez  Procedure(s) Performed: ATRIAL FIBRILLATION ABLATION  Patient Location: Cath Lab  Anesthesia Type:General  Level of Consciousness: drowsy and patient cooperative  Airway & Oxygen Therapy: Patient Spontanous Breathing and Patient connected to nasal cannula oxygen  Post-op Assessment: Report given to RN and Post -op Vital signs reviewed and stable  Post vital signs: Reviewed and stable  Last Vitals:  Vitals Value Taken Time  BP 125/60 06/27/21 1124  Temp    Pulse 90 06/27/21 1126  Resp 19 06/27/21 1126  SpO2 96 % 06/27/21 1126  Vitals shown include unvalidated device data.  Last Pain:  Vitals:   06/27/21 0652  TempSrc:   PainSc: 0-No pain         Complications: There were no known notable events for this encounter.

## 2021-06-27 NOTE — Anesthesia Postprocedure Evaluation (Signed)
Anesthesia Post Note  Patient: Jon Vasquez  Procedure(s) Performed: ATRIAL FIBRILLATION ABLATION     Patient location during evaluation: Cath Lab Anesthesia Type: General Level of consciousness: awake and alert Pain management: pain level controlled Vital Signs Assessment: post-procedure vital signs reviewed and stable Respiratory status: spontaneous breathing, nonlabored ventilation, respiratory function stable and patient connected to nasal cannula oxygen Cardiovascular status: blood pressure returned to baseline and stable Postop Assessment: no apparent nausea or vomiting Anesthetic complications: no   There were no known notable events for this encounter.  Last Vitals:  Vitals:   06/27/21 1324 06/27/21 1354  BP: 111/68 (!) 105/52  Pulse: 91 (!) 101  Resp: 17 17  Temp:    SpO2: 96% 97%    Last Pain:  Vitals:   06/27/21 1210  TempSrc:   PainSc: 0-No pain                 Catalina Gravel

## 2021-06-27 NOTE — Anesthesia Procedure Notes (Signed)
Procedure Name: Intubation Date/Time: 06/27/2021 8:47 AM Performed by: Amadeo Garnet, CRNA Pre-anesthesia Checklist: Patient identified, Emergency Drugs available, Suction available and Patient being monitored Patient Re-evaluated:Patient Re-evaluated prior to induction Oxygen Delivery Method: Circle system utilized Preoxygenation: Pre-oxygenation with 100% oxygen Induction Type: IV induction Ventilation: Mask ventilation without difficulty and Oral airway inserted - appropriate to patient size Laryngoscope Size: Mac and 4 Grade View: Grade II Tube type: Oral Number of attempts: 1 Airway Equipment and Method: Stylet and Oral airway Placement Confirmation: ETT inserted through vocal cords under direct vision, positive ETCO2 and breath sounds checked- equal and bilateral Secured at: 22 cm Tube secured with: Tape Dental Injury: Teeth and Oropharynx as per pre-operative assessment

## 2021-06-27 NOTE — H&P (Signed)
Electrophysiology Office Note   Date:  06/27/2021   ID:  Vasquez, Jon 1966-06-10, MRN [id number]  PCP:  Jon Sheriff, MD  Cardiologist:  Jon Vasquez Primary Electrophysiologist:  Jon Rufener Meredith Leeds, MD    Chief Complaint: AF   History of Present Illness: Jon Vasquez is a 55 y.o. male who is being seen today for the evaluation of AF at the request of No ref. provider found. Presenting today for electrophysiology evaluation.  He has a history significant for atrial fibrillation.  He was seen 02/13/2021 with palpitations atrial fibrillation.  He wore a cardiac monitor that showed a 2% atrial fibrillation burden.  Today, denies symptoms of palpitations, chest pain, shortness of breath, orthopnea, PND, lower extremity edema, claudication, dizziness, presyncope, syncope, bleeding, or neurologic sequela. The patient is tolerating medications without difficulties. Plan AF ablation today.    Past Medical History:  Diagnosis Date   Arthritis    Bronchitis    Diverticulitis    GERD (gastroesophageal reflux disease)    occasional    Pneumonia    Past Surgical History:  Procedure Laterality Date   APPENDECTOMY     COLONOSCOPY     POLYPECTOMY       Current Facility-Administered Medications  Medication Dose Route Frequency Provider Last Rate Last Admin   0.9 %  sodium chloride infusion   Intravenous Continuous Constance Haw, MD 50 mL/hr at 06/27/21 0647 New Bag at 06/27/21 4431    Allergies:   Patient has no known allergies.   Social History:  The patient  reports that he has never smoked. He has never used smokeless tobacco. He reports that he does not drink alcohol and does not use drugs.   Family History:  The patient's family history includes Cancer in his mother; Colon polyps in his mother; Diabetes in his mother; Heart disease in his father; Lung cancer in his mother; Pulmonary fibrosis in his father.   ROS:  Please see the history of present illness.    Otherwise, review of systems is positive for none.   All other systems are reviewed and negative.   PHYSICAL EXAM: VS:  BP 126/83   Pulse 79   Temp 98.3 F (36.8 C) (Oral)   Ht 6\' 1"  (1.854 m)   Wt 117.9 kg   SpO2 98%   BMI 34.30 kg/m  , BMI Body mass index is 34.3 kg/m. GEN: Well nourished, well developed, in no acute distress  HEENT: normal  Neck: no JVD, carotid bruits, or masses Cardiac: RRR; no murmurs, rubs, or gallops,no edema  Respiratory:  clear to auscultation bilaterally, normal work of breathing GI: soft, nontender, nondistended, + BS MS: no deformity or atrophy  Skin: warm and dry Neuro:  Strength and sensation are intact Psych: euthymic mood, full affect  Recent Labs: 06/18/2021: BUN 16; Creatinine, Ser 1.05; Hemoglobin 12.7; Platelets 198; Potassium 3.9; Sodium 140    Lipid Panel  No results found for: CHOL, TRIG, HDL, CHOLHDL, VLDL, LDLCALC, LDLDIRECT   Wt Readings from Last 3 Encounters:  06/27/21 117.9 kg  05/14/21 117.9 kg  04/06/21 121.8 kg      Other studies Reviewed: Additional studies/ records that were reviewed today include: Myoview 02/22/2021 Review of the above records today demonstrates:  The left ventricular ejection fraction is normal (55-65%). Nuclear stress EF: 64%. There was no ST segment deviation noted during stress. No T wave inversion was noted during stress. The study is normal. This is a low risk study.  Cardiac monitor 02/27/2021 personally reviewed Atrial fibrillation was present with an A. fib burden of 2%.  There were 61 episodes 3 greater than 6 minutes the longest 47 minutes average heart rate of 160 bpm.  ASSESSMENT AND PLAN:  1.  Paroxysmal atrial fibrillation: [name] has presented today for surgery, with the diagnosis of atrial fibrillation.  The various methods of treatment have been discussed with the patient and family. After consideration of risks, benefits and other options for treatment, the patient has  consented to  Procedure(s): Catheter ablation as a surgical intervention .  Risks include but not limited to complete heart block, stroke, esophageal damage, nerve damage, bleeding, vascular damage, tamponade, perforation, MI, and death. The patient's history has been reviewed, patient examined, no change in status, stable for surgery.  I have reviewed the patient's chart and labs.  Questions were answered to the patient's satisfaction.    Harriet Bollen Curt Bears, MD 06/27/2021 7:43 AM

## 2021-06-28 ENCOUNTER — Encounter (HOSPITAL_COMMUNITY): Payer: Self-pay | Admitting: Cardiology

## 2021-07-03 ENCOUNTER — Telehealth: Payer: Self-pay | Admitting: *Deleted

## 2021-07-03 DIAGNOSIS — R9389 Abnormal findings on diagnostic imaging of other specified body structures: Secondary | ICD-10-CM

## 2021-07-03 NOTE — Telephone Encounter (Signed)
-----   Message from Will Meredith Leeds, MD sent at 06/22/2021  1:55 PM EDT ----- No PE. Needs GI referral due to thickening of esophagus.

## 2021-07-25 ENCOUNTER — Ambulatory Visit (HOSPITAL_COMMUNITY)
Admission: RE | Admit: 2021-07-25 | Payer: BC Managed Care – PPO | Source: Ambulatory Visit | Attending: Nurse Practitioner | Admitting: Nurse Practitioner

## 2021-07-25 ENCOUNTER — Encounter (HOSPITAL_COMMUNITY): Payer: Self-pay

## 2021-07-31 ENCOUNTER — Other Ambulatory Visit: Payer: Self-pay

## 2021-07-31 ENCOUNTER — Encounter (HOSPITAL_COMMUNITY): Payer: Self-pay | Admitting: Physician Assistant

## 2021-07-31 ENCOUNTER — Encounter (HOSPITAL_COMMUNITY): Payer: Self-pay

## 2021-07-31 ENCOUNTER — Ambulatory Visit (HOSPITAL_COMMUNITY)
Admission: RE | Admit: 2021-07-31 | Discharge: 2021-07-31 | Disposition: A | Discharge status: Home / Self Care | Payer: BC Managed Care – PPO | Source: Ambulatory Visit | Attending: Physician Assistant | Admitting: Physician Assistant

## 2021-07-31 VITALS — BP 132/78 | HR 77 | Ht 73.0 in | Wt 271.4 lb

## 2021-07-31 DIAGNOSIS — Z79899 Other long term (current) drug therapy: Secondary | ICD-10-CM | POA: Insufficient documentation

## 2021-07-31 DIAGNOSIS — E669 Obesity, unspecified: Secondary | ICD-10-CM | POA: Insufficient documentation

## 2021-07-31 DIAGNOSIS — I480 Paroxysmal atrial fibrillation: Secondary | ICD-10-CM | POA: Insufficient documentation

## 2021-07-31 DIAGNOSIS — Z6835 Body mass index (BMI) 35.0-35.9, adult: Secondary | ICD-10-CM | POA: Insufficient documentation

## 2021-07-31 DIAGNOSIS — Z7182 Exercise counseling: Secondary | ICD-10-CM | POA: Diagnosis not present

## 2021-07-31 DIAGNOSIS — Z7901 Long term (current) use of anticoagulants: Secondary | ICD-10-CM | POA: Diagnosis not present

## 2021-07-31 NOTE — Progress Notes (Signed)
Primary Care Physician: Angelina Sheriff, MD Primary Cardiologist: Dr Bettina Gavia Primary Electrophysiologist: Dr Curt Bears Referring Physician: Dr Octaviano Glow is a 55 y.o. male with a history of atrial fibrillation who presents for follow up in [facility].  Jon Vasquez seen 01/2021 with palpitations and a cardiac monitor was placed which showed 2% afib burden. Vasquez is on Eliquis for a CHADS2VASC score of 0. Jon Vasquez underwent afib ablation with Dr Curt Bears on 06/27/21. Vasquez reports Jon Vasquez has done very well since Jon procedure with no palpitations. Jon Vasquez denies CP, swallowing pain, or groin issues.   Today, Jon Vasquez denies symptoms of palpitations, chest pain, shortness of breath, orthopnea, PND, lower extremity edema, dizziness, presyncope, syncope, snoring, daytime somnolence, bleeding, or neurologic sequela. Jon Vasquez is tolerating medications without difficulties and is otherwise without complaint today.    Atrial Fibrillation Risk Factors:  Jon Vasquez does not have symptoms or diagnosis of sleep apnea. Jon Vasquez does not have a history of rheumatic fever.   Jon Vasquez has a BMI of Body mass index is 35.81 kg/m.Marland Kitchen Filed Weights   07/31/21 1553  Weight: 123.1 kg    Family History  Problem Relation Age of Onset   Cancer Mother    Diabetes Mother    Lung cancer Mother    Colon polyps Mother    Heart disease Father    Pulmonary fibrosis Father    Colon cancer Neg Hx    Esophageal cancer Neg Hx    Rectal cancer Neg Hx    Stomach cancer Neg Hx      Atrial Fibrillation Management history:  Previous antiarrhythmic drugs: none Previous cardioversions: none Previous ablations: 06/27/21 CHADS2VASC score: 0 Anticoagulation history: Eliquis   Past Medical History:  Diagnosis Date   Arthritis    Bronchitis    Diverticulitis    GERD (gastroesophageal reflux disease)    occasional    Pneumonia    Past Surgical History:  Procedure Laterality Date    APPENDECTOMY     ATRIAL FIBRILLATION ABLATION N/A 06/27/2021   Procedure: ATRIAL FIBRILLATION ABLATION;  Surgeon: Constance Haw, MD;  Location: [facility];  Service: Cardiovascular;  Laterality: N/A;   COLONOSCOPY     POLYPECTOMY      Current Outpatient Medications  Medication Sig Dispense Refill   apixaban (ELIQUIS) 5 MG TABS tablet Take 1 tablet (5 mg total) by mouth 2 (two) times daily. 60 tablet 6   cholecalciferol (VITAMIN D3) 25 MCG (1000 UNIT) tablet Take 1,000 Units by mouth daily.     diclofenac (VOLTAREN) 75 MG EC tablet Take 75 mg by mouth 2 (two) times daily.     ELDERBERRY PO Take 1 tablet by mouth daily.     meloxicam (MOBIC) 15 MG tablet TAKE 1 TABLET (15 MG TOTAL) BY MOUTH DAILY. 30 tablet 0   metoprolol tartrate (LOPRESSOR) 25 MG tablet Take 25 mg by mouth 2 (two) times daily.     Multiple Vitamin (MULTIVITAMIN) tablet Take 1 tablet by mouth daily.     nitroGLYCERIN (NITROSTAT) 0.4 MG SL tablet Place 1 tablet under Jon tongue every 5 (five) minutes as needed for chest pain.     Omega 3 1000 MG CAPS Take 1,000 mg by mouth daily.     Probiotic Product (PROBIOTIC PO) Take 1 capsule by mouth daily.     vitamin B-12 (CYANOCOBALAMIN) 1000 MCG tablet Take 2,000 mcg by mouth daily.     zinc gluconate 50 MG  tablet Take 50 mg by mouth in Jon morning and at bedtime.     No current facility-administered medications for this encounter.    No Known Allergies  Social History   Socioeconomic History   Marital status: Legally Separated    Spouse name: Not on file   Number of children: Not on file   Years of education: Not on file   Highest education level: Not on file  Occupational History   Not on file  Tobacco Use   Smoking status: Never   Smokeless tobacco: Never  Substance and Sexual Activity   Alcohol use: No   Drug use: No   Sexual activity: Not on file  Other Topics Concern   Not on file  Social History Narrative   Not on file   Social  Determinants of Health   Financial Resource Strain: Not on file  Food Insecurity: Not on file  Transportation Needs: Not on file  Physical Activity: Not on file  Stress: Not on file  Social Connections: Not on file  Intimate Partner Violence: Not on file     ROS- All systems are reviewed and negative except as per Jon HPI above.  Physical Exam: Vitals:   07/31/21 1553  BP: 132/78  Pulse: 77  Weight: 123.1 kg  Height: 6\' 1"  (1.854 m)    GEN- Jon [facility]quez is a well appearing obese male, alert and oriented x 3 today.   Head- normocephalic, atraumatic Eyes-  Sclera clear, conjunctiva pink Ears- hearing intact Oropharynx- clear Neck- supple  Lungs- Clear to ausculation bilaterally, normal work of breathing Heart- Regular rate and rhythm, no murmurs, rubs or gallops  GI- soft, NT, ND, + BS Extremities- no clubbing, cyanosis, or edema MS- no significant deformity or atrophy Skin- no rash or lesion Psych- euthymic mood, full affect Neuro- strength and sensation are intact  Wt Readings from Last 3 Encounters:  07/31/21 123.1 kg  06/27/21 117.9 kg  05/14/21 117.9 kg    EKG today demonstrates  SR Vent. rate 77 BPM PR interval 166 ms QRS duration 92 ms QT/QTcB 380/430 ms   Epic records are reviewed at length today  CHA2DS2-VASc Score = 0  [facility]quez's score is based upon: CHF History: 0 HTN History: 0 Diabetes History: 0 Stroke History: 0 Vascular Disease History: 0 Age Score: 0 Gender Score: 0      ASSESSMENT AND PLAN: 1. Paroxysmal Atrial Fibrillation (ICD10:  I48.0) Jon Vasquez's CHA2DS2-VASc score is 0, indicating a 0.2% annual risk of stroke.   S/p afib ablation 06/27/21 Vasquez appears to be maintaining SR Continue Lopressor 25 mg BID Continue Eliquis 5 mg BID with no missed doses for 3 months post ablation.   2. Obesity Body mass index is 35.81 kg/m. Lifestyle modification was discussed at length including regular exercise and weight  reduction.   Follow up with Dr Curt Bears as scheduled.    [facility] [address] [place], [place] [address] [phone] 07/31/2021 4:11 PM

## 2021-08-30 ENCOUNTER — Encounter: Payer: Self-pay | Admitting: Podiatry

## 2021-08-30 ENCOUNTER — Ambulatory Visit (INDEPENDENT_AMBULATORY_CARE_PROVIDER_SITE_OTHER): Payer: BC Managed Care – PPO | Admitting: Podiatry

## 2021-08-30 DIAGNOSIS — M722 Plantar fascial fibromatosis: Secondary | ICD-10-CM

## 2021-08-30 DIAGNOSIS — G5762 Lesion of plantar nerve, left lower limb: Secondary | ICD-10-CM

## 2021-08-30 MED ORDER — BETAMETHASONE SOD PHOS & ACET 6 (3-3) MG/ML IJ SUSP
6.0000 mg | Freq: Once | INTRAMUSCULAR | Status: AC
  Administered 2021-08-30: 6 mg

## 2021-08-30 NOTE — Patient Instructions (Signed)
Morton Neuralgia °Morton neuralgia is foot pain that affects the ball of the foot and the area near the toes. Morton neuralgia occurs when part of a nerve in the foot (digital nerve) is under too much pressure (compressed). When this happens over a long period of time, the nerve can thicken (neuroma) and cause pain. Pain usually occurs between the third and fourth toes.  °Morton neuralgia can come and go but may get worse over time. °What are the causes? °This condition is caused by doing the same things over and over with your foot, such as: °Activities such as running or jumping. °Wearing shoes that are too tight. °What increases the risk? °You may be at higher risk for Morton neuralgia if you: °Are male. °Wear high heels. °Wear shoes that are narrow or tight. °Do activities that repeatedly stretch your toes, such as: °Running. °Ballet. °Long-distance walking. °What are the signs or symptoms? °The first symptom of Morton neuralgia is pain that spreads from the ball of the foot to the toes. It may feel like you are walking on a marble. Pain usually gets worse with walking and goes away at night. Other symptoms may include numbness and cramping of your toes. Both feet are equally affected, but rarely at the same time. °How is this diagnosed? °This condition is diagnosed based on your symptoms, your medical history, and a physical exam. Your health care provider may: °Squeeze your foot just behind your toe. °Ask you to move your toes to check for pain. °Ask about your physical activity level. °You also may have imaging tests, such as an X-ray, ultrasound, or MRI. °How is this treated? °Treatment depends on how severe your condition is and what causes it. Treatment may involve: °Wearing different shoes that are not too tight, are low-heeled, and provide good support. For some people, this is the only treatment needed. °Wearing an over-the-counter or custom supportive pad (orthotic) under the front of your  foot. °Getting injections of numbing medicine and anti-inflammatory medicine (steroid) in the nerve. °Having surgery to remove part of the thickened nerve. °Follow these instructions at home: °Managing pain, stiffness, and swelling ° °Massage your foot as needed. °Wear orthotics as told by your health care provider. °If directed, put ice on the painful area. To do this: °Put ice in a plastic bag. °Place a towel between your skin and the bag. °Leave the ice on for 20 minutes, 2-3 times a day. °Remove the ice if your skin turns bright red. This is very important. If you cannot feel pain, heat, or cold, you have a greater risk of damage to the area. °Raise (elevate) the injured area above the level of your heart while you are sitting or lying down. °Avoid activities that cause pain or make pain worse. If you play sports, ask your health care provider when it is safe for you to return to sports. °General instructions °Take over-the-counter and prescription medicines only as told by your health care provider. °For the time period you were told by your health care provider, do not drive or use machinery. °Wear shoes that: °Have soft soles. °Have a wide toe area. °Provide arch support. °Do not pinch or squeeze your feet. °Have room for your orthotics, if this applies. °Keep all follow-up visits. This is important. °Contact a health care provider if: °Your symptoms get worse or do not get better with treatment and home care. °Summary °Morton neuralgia is foot pain that affects the ball of the foot and the area   near the toes. Pain usually occurs between the third and fourth toes, gets worse with walking, and goes away at night. °Morton neuralgia occurs when part of a nerve in the foot (digital nerve) is under too much pressure. When this happens over a long period of time, the nerve can thicken (neuroma) and cause pain. °This condition is caused by doing the same things over and over with your foot, such as running or  jumping, wearing shoes that are too tight, or wearing high heels. °Treatment may involve wearing low-heeled shoes that are not too tight, wearing a supportive pad (orthotic) under the front of your foot, getting injections in the nerve, or having surgery to remove part of the thickened nerve. °This information is not intended to replace advice given to you by your health care provider. Make sure you discuss any questions you have with your health care provider. °Document Revised: 02/16/2021 Document Reviewed: 02/16/2021 °Elsevier Patient Education © 2022 [employer] ° °

## 2021-08-30 NOTE — Progress Notes (Signed)
  Subjective:  Patient ID: Jon [facility], male    DOB: 1965-11-17,  MRN: [id number]  Chief Complaint  Patient presents with   Plantar Fasciitis    The left heel is still hurting and my toes feel like jelly and the brace hurts more   55 y.o. male presents with the above complaint. History confirmed with patient. Feels some numbness in the ball of the foot area and pain. Objective:  Physical Exam: warm, good capillary refill, no trophic changes or ulcerative lesions, normal DP and PT pulses, and normal sensory exam. Left Foot: tenderness to palpation medial calcaneal tuber, no pain with calcaneal squeeze, decreased ankle joint ROM, and +Silverskiold test  normal exam, no swelling, tenderness, instability; ligaments intact, full range of motion of all ankle/foot joints other than findings noted above.  Assessment:   1. Plantar fasciitis of left foot   2. Morton neuroma, left    Plan:  Patient was evaluated and treated and all questions answered.  Plantar Fasciitis -No pain at this area today. No need for injection today.  Morton Neuroma -Educated on etiology -Injection delivered to the affected interspaces  Procedure: Neuroma Injection Location: Left 3rd interspace Skin Prep: Alcohol. Injectate: 0.5 cc 0.5% marcaine plain, 0.5 cc betamethasone acetate-betamethasone sodium phosphate Disposition: Patient tolerated procedure well. Injection site dressed with a band-aid.   Return in about 4 weeks (around 09/27/2021) for Neuroma.

## 2021-09-30 NOTE — Progress Notes (Signed)
Electrophysiology Office Note   Date:  10/01/2021   ID:  Jon, Vasquez [birth year], MRN [id number]  PCP:  Angelina Sheriff, MD  Cardiologist:  Bettina Gavia Primary Electrophysiologist:  Skarleth Delmonico Meredith Leeds, MD    Chief Complaint: AF   History of Present Illness: Jon Vasquez is a 56 y.o. male who is being seen today for the evaluation of AF at the request of Angelina Sheriff, MD. Presenting today for electrophysiology evaluation.  He has a history significant for atrial fibrillation.  He was seen 02/13/2021 with palpitations and atrial fibrillation.  He wore a cardiac monitor that showed a 2% burden.  He is now status post ablation 06/27/2021.  Today, denies symptoms of palpitations, chest pain, shortness of breath, orthopnea, PND, lower extremity edema, claudication, dizziness, presyncope, syncope, bleeding, or neurologic sequela. The patient is tolerating medications without difficulties.  Being seen he has done well.  He is noted no further episodes of atrial fibrillation.  He has much more energy and no shortness of breath.  He has been trying to exercise and has been doing well.  He is overall happy with his control.  He is ready to return to the gym.   Past Medical History:  Diagnosis Date   Arthritis    Bronchitis    Diverticulitis    GERD (gastroesophageal reflux disease)    occasional    Pneumonia    Past Surgical History:  Procedure Laterality Date   APPENDECTOMY     ATRIAL FIBRILLATION ABLATION N/A 06/27/2021   Procedure: ATRIAL FIBRILLATION ABLATION;  Surgeon: Constance Haw, MD;  Location: [facility];  Service: Cardiovascular;  Laterality: N/A;   COLONOSCOPY     POLYPECTOMY       Current Outpatient Medications  Medication Sig Dispense Refill   cholecalciferol (VITAMIN D3) 25 MCG (1000 UNIT) tablet Take 1,000 Units by mouth daily.     diclofenac (VOLTAREN) 75 MG EC tablet Take 75 mg by mouth 2 (two) times daily.     ELDERBERRY PO Take 1  tablet by mouth daily.     meloxicam (MOBIC) 15 MG tablet TAKE 1 TABLET (15 MG TOTAL) BY MOUTH DAILY. 30 tablet 0   metoprolol tartrate (LOPRESSOR) 25 MG tablet Take 25 mg by mouth 2 (two) times daily.     Multiple Vitamin (MULTIVITAMIN) tablet Take 1 tablet by mouth daily.     nitroGLYCERIN (NITROSTAT) 0.4 MG SL tablet Place 1 tablet under the tongue every 5 (five) minutes as needed for chest pain.     Omega 3 1000 MG CAPS Take 1,000 mg by mouth daily.     Probiotic Product (PROBIOTIC PO) Take 1 capsule by mouth daily.     vitamin B-12 (CYANOCOBALAMIN) 1000 MCG tablet Take 2,000 mcg by mouth daily.     zinc gluconate 50 MG tablet Take 50 mg by mouth in the morning and at bedtime.     No current facility-administered medications for this visit.    Allergies:   Patient has no known allergies.   Social History:  The patient  reports that he has never smoked. He has never used smokeless tobacco. He reports that he does not drink alcohol and does not use drugs.   Family History:  The patient's family history includes Cancer in his mother; Colon polyps in his mother; Diabetes in his mother; Heart disease in his father; Lung cancer in his mother; Pulmonary fibrosis in his father.    ROS:  Please  see the history of present illness.   Otherwise, review of systems is positive for none.   All other systems are reviewed and negative.   PHYSICAL EXAM: VS:  BP 130/64    Pulse 81    Ht 6\' 1"  (1.854 m)    Wt 271 lb 3.2 oz (123 kg)    SpO2 95%    BMI 35.78 kg/m  , BMI Body mass index is 35.78 kg/m. GEN: Well nourished, well developed, in no acute distress  HEENT: normal  Neck: no JVD, carotid bruits, or masses Cardiac: RRR; no murmurs, rubs, or gallops,no edema  Respiratory:  clear to auscultation bilaterally, normal work of breathing GI: soft, nontender, nondistended, + BS MS: no deformity or atrophy  Skin: warm and dry Neuro:  Strength and sensation are intact Psych: euthymic mood, full  affect  EKG:  EKG is ordered today. Personal review of the ekg ordered shows sinus rhythm, rate 81  Recent Labs: 06/18/2021: BUN 16; Creatinine, Ser 1.05; Hemoglobin 12.7; Platelets 198; Potassium 3.9; Sodium 140    Lipid Panel  No results found for: CHOL, TRIG, HDL, CHOLHDL, VLDL, LDLCALC, LDLDIRECT   Wt Readings from Last 3 Encounters:  10/01/21 271 lb 3.2 oz (123 kg)  07/31/21 271 lb 6.4 oz (123.1 kg)  06/27/21 260 lb (117.9 kg)      Other studies Reviewed: Additional studies/ records that were reviewed today include: Myoview 02/22/2021 Review of the above records today demonstrates:  The left ventricular ejection fraction is normal (55-65%). Nuclear stress EF: 64%. There was no ST segment deviation noted during stress. No T wave inversion was noted during stress. The study is normal. This is a low risk study.  Cardiac monitor 02/27/2021 personally reviewed Atrial fibrillation was present with an A. fib burden of 2%.  There were 61 episodes 3 greater than 6 minutes the longest 47 minutes average heart rate of 160 bpm.  ASSESSMENT AND PLAN:  1.  Paroxysmal atrial fibrillation: Cardiac monitor with 2% burden.  Currently on Eliquis.  CHA2DS2-VASc of 0.  Status post ablation 06/27/2021.  He has had no further episodes of atrial fibrillation.  We Cleave Ternes plan to stop Eliquis today.  2.  Obesity: Body mass index is 35.78 kg/m. Diet and exercise encouraged  Current medicines are reviewed at length with the patient today.   The patient does not have concerns regarding his medicines.  The following changes were made today: stop eliquis  Labs/ tests ordered today include:  Orders Placed This Encounter  Procedures   EKG 12-Lead     Disposition:   FU with Smaran Gaus 3 months  Signed, Travious Vanover Meredith Leeds, MD  10/01/2021 12:15 PM     [facility] [address] [place] [address] [phone] (office) [phone] (fax)

## 2021-10-01 ENCOUNTER — Ambulatory Visit (INDEPENDENT_AMBULATORY_CARE_PROVIDER_SITE_OTHER): Payer: BC Managed Care – PPO | Admitting: Cardiology

## 2021-10-01 ENCOUNTER — Encounter: Payer: Self-pay | Admitting: Cardiology

## 2021-10-01 ENCOUNTER — Other Ambulatory Visit: Payer: Self-pay

## 2021-10-01 VITALS — BP 130/64 | HR 81 | Ht 73.0 in | Wt 271.2 lb

## 2021-10-01 DIAGNOSIS — I480 Paroxysmal atrial fibrillation: Secondary | ICD-10-CM

## 2021-10-01 NOTE — Patient Instructions (Addendum)
Medication Instructions:  Your physician has recommended you make the following change in your medication:  STOP Eliquis  *If you need a refill on your cardiac medications before your next appointment, please call your pharmacy*   Lab Work: None ordered   Testing/Procedures: None ordered   Follow-Up: At [facility], you and your health needs are our priority.  As part of our continuing mission to provide you with exceptional heart care, we have created designated Provider Care Teams.  These Care Teams include your primary Cardiologist (physician) and Advanced Practice Providers (APPs -  Physician Assistants and Nurse Practitioners) who all work together to provide you with the care you need, when you need it.  Your next appointment:   3 month(s)  The format for your next appointment:   In Person  Provider:   Allegra Lai, MD    Thank you for choosing [facility]!!   Trinidad Curet, RN [phone]

## 2021-10-11 ENCOUNTER — Encounter: Payer: Self-pay | Admitting: Podiatry

## 2021-10-11 ENCOUNTER — Ambulatory Visit (INDEPENDENT_AMBULATORY_CARE_PROVIDER_SITE_OTHER): Payer: BC Managed Care – PPO | Admitting: Podiatry

## 2021-10-11 ENCOUNTER — Other Ambulatory Visit: Payer: Self-pay

## 2021-10-11 DIAGNOSIS — M722 Plantar fascial fibromatosis: Secondary | ICD-10-CM

## 2021-10-11 DIAGNOSIS — G5762 Lesion of plantar nerve, left lower limb: Secondary | ICD-10-CM | POA: Diagnosis not present

## 2021-10-11 NOTE — Progress Notes (Signed)
°  Subjective:  Patient ID: Jon Vasquez, male    DOB: [birth year],  MRN: [id number]  Chief Complaint  Patient presents with   Neuroma    The 2nd toe on the left is better and may have some arthritis and the toe curls under   Plantar Fasciitis    The shot did help the let heel and I am wearing the widest shoe I can find   56 y.o. male presents with the above complaint. History confirmed with patient. No pain today but feels like his foot is a little wide. Objective:  Physical Exam: warm, good capillary refill, no trophic changes or ulcerative lesions, normal DP and PT pulses, and normal sensory exam. Left Foot: tenderness to palpation medial calcaneal tuber, no pain with calcaneal squeeze, decreased ankle joint ROM, and +Silverskiold test  normal exam, no swelling, tenderness, instability; ligaments intact, full range of motion of all ankle/foot joints other than findings noted above.  Assessment:   1. Plantar fasciitis of left foot   2. Morton neuroma, left    Plan:  Patient was evaluated and treated and all questions answered.  Plantar Fasciitis -Remains resolved. No injection today.  Morton Neuroma -Improved. Hold off further injection as this time. Advised the sensation may remain for a while. -Dispesned metatarsal pads, educated on use.  No follow-ups on file.

## 2021-12-12 ENCOUNTER — Other Ambulatory Visit: Payer: Self-pay

## 2021-12-18 ENCOUNTER — Encounter: Payer: Self-pay | Admitting: Physician Assistant

## 2021-12-18 ENCOUNTER — Ambulatory Visit (INDEPENDENT_AMBULATORY_CARE_PROVIDER_SITE_OTHER): Payer: BC Managed Care – PPO | Admitting: Physician Assistant

## 2021-12-18 VITALS — BP 128/80 | HR 88 | Ht 72.0 in | Wt 273.2 lb

## 2021-12-18 DIAGNOSIS — K219 Gastro-esophageal reflux disease without esophagitis: Secondary | ICD-10-CM

## 2021-12-18 DIAGNOSIS — R933 Abnormal findings on diagnostic imaging of other parts of digestive tract: Secondary | ICD-10-CM

## 2021-12-18 NOTE — Progress Notes (Signed)
? ?Chief Complaint: Abnormal CT of the esophagus ? ?HPI: ?   Jon Vasquez is a 56 year old male with a past medical history as listed below including A-fib status post ablation stable and just off of Eliquis in January (echo 02/22/2021 with an EF of 64%), including reflux, known to Dr. Lyndel Safe, who was referred to me by Jacqlyn Larsen II, MD for a complaint of abnormal CT of the esophagus. ?   12/23/2019 colonoscopy with a diminutive colonic polyp status post polypectomy and mild pancolonic diverticulosis and otherwise normal.  Pathology showed benign mucosa.  Repeat recommended in 5 years given strong family history of colon polyps. ?   06/22/2021 patient had CT angiography of the chest for possible pulmonary embolus, there was no evidence of pulmonary embolism but there was persistent irregular mural thickening of the distal third of the esophagus with adjacent prominent but nonenlarged lymph nodes.  Further evaluation with nonemergent endoscopy was recommended.  Also hepatic steatosis. ?   Today, the patient tells me that he does have reflux symptoms which occur maybe once or twice a week and only if he eats the wrong thing after 6 PM.  When this occurs he will "chew Tums like crazy" and often wake up with an acid taste in his mouth.  And has to sleep at an incline.  If he avoids trigger foods after 6 PM he is fine. ?   Does tell me that he has had no problems with A-fib since his ablation and is no longer taking his Eliquis. ?   Denies fever, chills, weight loss, abdominal pain, dysphagia or change in bowel habits. ? ?Past Medical History:  ?Diagnosis Date  ? Arthritis   ? Bronchitis   ? Diverticulitis   ? GERD (gastroesophageal reflux disease)   ? occasional   ? Pneumonia   ? ? ?Past Surgical History:  ?Procedure Laterality Date  ? APPENDECTOMY    ? ATRIAL FIBRILLATION ABLATION N/A 06/27/2021  ? Procedure: ATRIAL FIBRILLATION ABLATION;  Surgeon: Constance Haw, MD;  Location: [facility];  Service:  Cardiovascular;  Laterality: N/A;  ? COLONOSCOPY    ? POLYPECTOMY    ? ? ?Current Outpatient Medications  ?Medication Sig Dispense Refill  ? cholecalciferol (VITAMIN D3) 25 MCG (1000 UNIT) tablet Take 1,000 Units by mouth daily.    ? diclofenac (VOLTAREN) 75 MG EC tablet Take 75 mg by mouth 2 (two) times daily.    ? ELDERBERRY PO Take 1 tablet by mouth daily.    ? ELIQUIS 5 MG TABS tablet Take 5 mg by mouth 2 (two) times daily.    ? meloxicam (MOBIC) 15 MG tablet TAKE 1 TABLET (15 MG TOTAL) BY MOUTH DAILY. 30 tablet 0  ? metoprolol tartrate (LOPRESSOR) 25 MG tablet Take 25 mg by mouth 2 (two) times daily.    ? Multiple Vitamin (MULTIVITAMIN) tablet Take 1 tablet by mouth daily.    ? nitroGLYCERIN (NITROSTAT) 0.4 MG SL tablet Place 1 tablet under the tongue every 5 (five) minutes as needed for chest pain.    ? Omega 3 1000 MG CAPS Take 1,000 mg by mouth daily.    ? Probiotic Product (PROBIOTIC PO) Take 1 capsule by mouth daily.    ? vitamin B-12 (CYANOCOBALAMIN) 1000 MCG tablet Take 2,000 mcg by mouth daily.    ? zinc gluconate 50 MG tablet Take 50 mg by mouth in the morning and at bedtime.    ? ?No current facility-administered medications for this visit.  ? ? ?  Allergies as of 12/18/2021  ? (No Known Allergies)  ? ? ?Family History  ?Problem Relation Age of Onset  ? Cancer Mother   ? Diabetes Mother   ? Lung cancer Mother   ? Colon polyps Mother   ? Heart disease Father   ? Pulmonary fibrosis Father   ? Colon cancer Neg Hx   ? Esophageal cancer Neg Hx   ? Rectal cancer Neg Hx   ? Stomach cancer Neg Hx   ? ? ?Social History  ? ?Socioeconomic History  ? Marital status: Legally Separated  ?  Spouse name: Not on file  ? Number of children: Not on file  ? Years of education: Not on file  ? Highest education level: Not on file  ?Occupational History  ? Not on file  ?Tobacco Use  ? Smoking status: Never  ? Smokeless tobacco: Never  ?Substance and Sexual Activity  ? Alcohol use: No  ? Drug use: No  ? Sexual activity: Not on  file  ?Other Topics Concern  ? Not on file  ?Social History Narrative  ? Not on file  ? ?Social Determinants of Health  ? ?Financial Resource Strain: Not on file  ?Food Insecurity: Not on file  ?Transportation Needs: Not on file  ?Physical Activity: Not on file  ?Stress: Not on file  ?Social Connections: Not on file  ?Intimate Partner Violence: Not on file  ? ? ?Review of Systems:    ?Constitutional: No weight loss, fever or chills ?Cardiovascular: No chest pain ?Respiratory: No SOB ?Gastrointestinal: See HPI and otherwise negative ? ? Physical Exam:  ?Vital signs: ?BP 128/80   Pulse 88   Ht 6' (1.829 m)   Wt 273 lb 3.2 oz (123.9 kg)   SpO2 93%   BMI 37.05 kg/m?   ? ?Constitutional:   Pleasant Caucasian male appears to be in NAD, Well developed, Well nourished, alert and cooperative ?Respiratory: Respirations even and unlabored. Lungs clear to auscultation bilaterally.   No wheezes, crackles, or rhonchi.  ?Cardiovascular: Normal S1, S2. No MRG. Regular rate and rhythm. No peripheral edema, cyanosis or pallor.  ?Gastrointestinal:  Soft, nondistended, nontender. No rebound or guarding. Normal bowel sounds. No appreciable masses or hepatomegaly. ?Rectal:  Not performed.  ?Psychiatric: Oriented to person, place and time. Demonstrates good judgement and reason without abnormal affect or behaviors. ? ?MOST RECENT LABS: ?CBC ?   ?Component Value Date/Time  ? WBC 5.4 06/18/2021 1427  ? WBC 7.1 09/15/2018 1237  ? RBC 4.04 (L) 06/18/2021 1427  ? RBC 5.28 09/15/2018 1237  ? HGB 12.7 (L) 06/18/2021 1427  ? HCT 36.8 (L) 06/18/2021 1427  ? PLT 198 06/18/2021 1427  ? MCV 91 06/18/2021 1427  ? MCH 31.4 06/18/2021 1427  ? MCH 28.6 09/15/2018 1237  ? MCHC 34.5 06/18/2021 1427  ? MCHC 32.7 09/15/2018 1237  ? RDW 13.3 06/18/2021 1427  ? LYMPHSABS 0.8 09/15/2018 1237  ? MONOABS 0.6 09/15/2018 1237  ? EOSABS 0.0 09/15/2018 1237  ? BASOSABS 0.0 09/15/2018 1237  ? ? ?CMP  ?   ?Component Value Date/Time  ? NA 140 06/18/2021 1427  ? K  3.9 06/18/2021 1427  ? CL 103 06/18/2021 1427  ? CO2 21 06/18/2021 1427  ? GLUCOSE 115 (H) 06/18/2021 1427  ? GLUCOSE 114 (H) 09/15/2018 1237  ? BUN 16 06/18/2021 1427  ? CREATININE 1.05 06/18/2021 1427  ? CALCIUM 8.4 (L) 06/18/2021 1427  ? PROT 7.1 09/15/2018 1237  ? ALBUMIN 3.8 09/15/2018 1237  ?  AST 24 09/15/2018 1237  ? ALT 33 09/15/2018 1237  ? ALKPHOS 69 09/15/2018 1237  ? BILITOT 1.1 09/15/2018 1237  ? GFRNONAA >60 09/15/2018 1237  ? GFRAA >60 09/15/2018 1237  ? ? ?Assessment: ?1.  Abnormal CT of the esophagus: Back in September had a CTA for question of pulmonary embolus, this showed an abnormality in his esophagus and it was recommended he have an EGD ?2.  GERD: About once or twice a week after eating trigger foods after 6 PM; likely reactive gastritis ? ?Plan: ?1.  Scheduled patient for diagnostic EGD in the [facility] with Dr. Lyndel Safe given abnormal CT.  Did provide the patient a detailed list of risks for procedure and he agrees to proceed. Patient is appropriate for endoscopic procedure(s) in the ambulatory ([facility]) setting.  ?2.  Discussed reflux symptoms, these are very infrequently maybe once a week and only if he eats trigger foods.  Recommend he try Pepcid 20 mg over-the-counter prior to eating something he feels like will bother him to see if this helps. ?3.  Patient follow in clinic per recommendations from Dr. Lyndel Safe after time of procedure. ? ?Ellouise Newer, PA-C ?[facility] ?12/18/2021, 2:03 PM ? ?Cc: Angelina Sheriff, MD  ?

## 2021-12-18 NOTE — Patient Instructions (Signed)
If you are age 58 or older, your body mass index should be between 23-30. Your Body mass index is 37.05 kg/m?Marland Kitchen If this is out of the aforementioned range listed, please consider follow up with your Primary Care Provider. ? ?If you are age 52 or younger, your body mass index should be between 19-25. Your Body mass index is 37.05 kg/m?Marland Kitchen If this is out of the aformentioned range listed, please consider follow up with your Primary Care Provider.  ? ?________________________________________________________ ? ?The [facility] providers would like to encourage you to use [facility] to communicate with providers for non-urgent requests or questions.  Due to long hold times on the telephone, sending your provider a message by [facility] may be a faster and more efficient way to get a response.  Please allow 48 business hours for a response.  Please remember that this is for non-urgent requests.  ?_______________________________________________________ ? ?You have been scheduled for an endoscopy. Please follow written instructions given to you at your visit today. ?If you use inhalers (even only as needed), please bring them with you on the day of your procedure. ? ?Uyse Pepcid 20 mg over the counter preemptively for GERD ? ?It was a pleasure to see you today! ? ?Thank you for trusting me with your gastrointestinal care!   ? ? ?

## 2021-12-20 ENCOUNTER — Ambulatory Visit (AMBULATORY_SURGERY_CENTER): Payer: BC Managed Care – PPO | Admitting: Gastroenterology

## 2021-12-20 ENCOUNTER — Encounter: Payer: Self-pay | Admitting: Gastroenterology

## 2021-12-20 VITALS — BP 129/81 | HR 76 | Temp 97.7°F | Resp 19 | Ht 72.0 in | Wt 273.0 lb

## 2021-12-20 DIAGNOSIS — K2970 Gastritis, unspecified, without bleeding: Secondary | ICD-10-CM

## 2021-12-20 DIAGNOSIS — K219 Gastro-esophageal reflux disease without esophagitis: Secondary | ICD-10-CM | POA: Diagnosis not present

## 2021-12-20 DIAGNOSIS — K449 Diaphragmatic hernia without obstruction or gangrene: Secondary | ICD-10-CM

## 2021-12-20 DIAGNOSIS — K2270 Barrett's esophagus without dysplasia: Secondary | ICD-10-CM | POA: Diagnosis not present

## 2021-12-20 DIAGNOSIS — K31A0 Gastric intestinal metaplasia, unspecified: Secondary | ICD-10-CM

## 2021-12-20 MED ORDER — SODIUM CHLORIDE 0.9 % IV SOLN: 500.0000 mL | INTRAVENOUS | Status: DC

## 2021-12-20 MED ORDER — PANTOPRAZOLE SODIUM 40 MG PO TBEC
DELAYED_RELEASE_TABLET | ORAL | 3 refills | Status: DC
Start: 2021-12-20 — End: 2022-06-19

## 2021-12-20 MED ORDER — SUCRALFATE 1 GM/10ML PO SUSP
1.0000 g | Freq: Four times a day (QID) | ORAL | 0 refills | Status: DC
Start: 2021-12-20 — End: 2022-08-05

## 2021-12-20 NOTE — Op Note (Signed)
[facility] ?Patient Name: Jon Vasquez ?Procedure Date: 12/20/2021 1:52 PM ?MRN: [id number] ?Endoscopist: Jackquline Denmark , MD ?Age: 56 ?Referring MD:  ?Date of Birth: Mar 06, 1966 ?Gender: Male ?Account #: [id number] ?Procedure:                Upper GI endoscopy ?Indications:              Abnormal CT of the GI tract showing distal  ?                          esophageal thickening. Longstanding GERD. ?Medicines:                Monitored Anesthesia Care ?Procedure:                Pre-Anesthesia Assessment: ?                          - Prior to the procedure, a History and Physical  ?                          was performed, and patient medications and  ?                          allergies were reviewed. The patient's tolerance of  ?                          previous anesthesia was also reviewed. The risks  ?                          and benefits of the procedure and the sedation  ?                          options and risks were discussed with the patient.  ?                          All questions were answered, and informed consent  ?                          was obtained. Prior Anticoagulants: The patient has  ?                          taken no previous anticoagulant or antiplatelet  ?                          agents. ASA Grade Assessment: III - A patient with  ?                          severe systemic disease. After reviewing the risks  ?                          and benefits, the patient was deemed in  ?                          satisfactory condition to undergo the procedure. ?  After obtaining informed consent, the endoscope was  ?                          passed under direct vision. Throughout the  ?                          procedure, the patient's blood pressure, pulse, and  ?                          oxygen saturations were monitored continuously. The  ?                          Endoscope was introduced through the mouth, and  ?                          advanced to the second  part of duodenum. The upper  ?                          GI endoscopy was accomplished without difficulty.  ?                          The patient tolerated the procedure well. ?Scope In: ?Scope Out: ?Findings:                 There were esophageal mucosal changes suspicious  ?                          for long-segment Barrett's esophagus present in the  ?                          mid/distal esophagus extending from 32 to 38 cm.  ?                          Multiple superficial ulcers with a wide open  ?                          stricture was noted around 32 cm. The maximum  ?                          longitudinal extent of these mucosal changes was 5  ?                          cm in length. This was biopsied with a cold forceps  ?                          for histology. ?                          A 5 cm hiatal hernia was present extending from 38  ?                          up to 43 cm (GE junction up to diaphragmatic  ?  hiatus). ?                          Localized mild inflammation characterized by  ?                          erythema was found in the gastric antrum. Biopsies  ?                          were taken with a cold forceps for histology. ?                          The examined duodenum was normal. ?Complications:            No immediate complications. Patient did become  ?                          combative. Hence, this EGD to some extent was  ?                          limited. ?Estimated Blood Loss:     Estimated blood loss: none. ?Impression:               - Esophageal mucosal changes suspicious for  ?                          long-segment Barrett's esophagus with LA grade D  ?                          esophagitis and wide open esophageal stricture.  ?                          Biopsied. ?                          - 5 cm hiatal hernia. ?                          - Gastritis. Biopsied. ?                          - Normal examined duodenum. ?Recommendation:           - Patient  has a contact number available for  ?                          emergencies. The signs and symptoms of potential  ?                          delayed complications were discussed with the  ?                          patient. Return to normal activities tomorrow.  ?                          Written discharge instructions were provided to the  ?  patient. ?                          - Resume previous diet. ?                          - Use Protonix (pantoprazole) 40 mg PO BID x 12  ?                          weeks, then QD indefinitely. ?                          - Use sucralfate suspension/tabs (whichever is less  ?                          expensive, prefer suspension) 1 gram PO QID for 2  ?                          weeks. ?                          - Await pathology results. ?                          - Repeat upper endoscopy in 6 months- 13yrfor  ?                          surveillance and rpt biopsies. ?                          - The findings and recommendations were discussed  ?                          with the patient's family. ?RJackquline Denmark MD ?12/20/2021 2:14:30 PM ?This report has been signed electronically. ?

## 2021-12-20 NOTE — Progress Notes (Signed)
Chief Complaint: Abnormal CT of the esophagus ?  ?HPI: ?   Mr. Fonseca is a 56 year old male with a past medical history as listed below including A-fib status post ablation stable and just off of Eliquis in January (echo 02/22/2021 with an EF of 64%), including reflux, known to Dr. Lyndel Safe, who was referred to me by Jacqlyn Larsen II, MD for a complaint of abnormal CT of the esophagus. ?   12/23/2019 colonoscopy with a diminutive colonic polyp status post polypectomy and mild pancolonic diverticulosis and otherwise normal.  Pathology showed benign mucosa.  Repeat recommended in 5 years given strong family history of colon polyps. ?   06/22/2021 patient had CT angiography of the chest for possible pulmonary embolus, there was no evidence of pulmonary embolism but there was persistent irregular mural thickening of the distal third of the esophagus with adjacent prominent but nonenlarged lymph nodes.  Further evaluation with nonemergent endoscopy was recommended.  Also hepatic steatosis. ?   Today, the patient tells me that he does have reflux symptoms which occur maybe once or twice a week and only if he eats the wrong thing after 6 PM.  When this occurs he will "chew Tums like crazy" and often wake up with an acid taste in his mouth.  And has to sleep at an incline.  If he avoids trigger foods after 6 PM he is fine. ?   Does tell me that he has had no problems with A-fib since his ablation and is no longer taking his Eliquis. ?   Denies fever, chills, weight loss, abdominal pain, dysphagia or change in bowel habits. ?  ?    ?Past Medical History:  ?Diagnosis Date  ? Arthritis    ? Bronchitis    ? Diverticulitis    ? GERD (gastroesophageal reflux disease)    ?  occasional   ? Pneumonia    ?  ?  ?     ?Past Surgical History:  ?Procedure Laterality Date  ? APPENDECTOMY      ? ATRIAL FIBRILLATION ABLATION N/A 06/27/2021  ?  Procedure: ATRIAL FIBRILLATION ABLATION;  Surgeon: Constance Haw, MD;  Location: [facility];  Service: Cardiovascular;  Laterality: N/A;  ? COLONOSCOPY      ? POLYPECTOMY      ?  ?  ?      ?Current Outpatient Medications  ?Medication Sig Dispense Refill  ? cholecalciferol (VITAMIN D3) 25 MCG (1000 UNIT) tablet Take 1,000 Units by mouth daily.      ? diclofenac (VOLTAREN) 75 MG EC tablet Take 75 mg by mouth 2 (two) times daily.      ? ELDERBERRY PO Take 1 tablet by mouth daily.      ? ELIQUIS 5 MG TABS tablet Take 5 mg by mouth 2 (two) times daily.      ? meloxicam (MOBIC) 15 MG tablet TAKE 1 TABLET (15 MG TOTAL) BY MOUTH DAILY. 30 tablet 0  ? metoprolol tartrate (LOPRESSOR) 25 MG tablet Take 25 mg by mouth 2 (two) times daily.      ? Multiple Vitamin (MULTIVITAMIN) tablet Take 1 tablet by mouth daily.      ? nitroGLYCERIN (NITROSTAT) 0.4 MG SL tablet Place 1 tablet under the tongue every 5 (five) minutes as needed for chest pain.      ? Omega 3 1000 MG CAPS Take 1,000 mg by mouth daily.      ? Probiotic Product (PROBIOTIC PO) Take 1 capsule by  mouth daily.      ? vitamin B-12 (CYANOCOBALAMIN) 1000 MCG tablet Take 2,000 mcg by mouth daily.      ? zinc gluconate 50 MG tablet Take 50 mg by mouth in the morning and at bedtime.      ?  ?No current facility-administered medications for this visit.  ?  ?  ?   ?Allergies as of 12/18/2021  ? (No Known Allergies)  ?  ?  ?     ?Family History  ?Problem Relation Age of Onset  ? Cancer Mother    ? Diabetes Mother    ? Lung cancer Mother    ? Colon polyps Mother    ? Heart disease Father    ? Pulmonary fibrosis Father    ? Colon cancer Neg Hx    ? Esophageal cancer Neg Hx    ? Rectal cancer Neg Hx    ? Stomach cancer Neg Hx    ?  ?  ?Social History  ?  ?     ?Socioeconomic History  ? Marital status: Legally Separated  ?    Spouse name: Not on file  ? Number of children: Not on file  ? Years of education: Not on file  ? Highest education level: Not on file  ?Occupational History  ? Not on file  ?Tobacco Use  ? Smoking status: Never  ? Smokeless tobacco: Never   ?Substance and Sexual Activity  ? Alcohol use: No  ? Drug use: No  ? Sexual activity: Not on file  ?Other Topics Concern  ? Not on file  ?Social History Narrative  ? Not on file  ?  ?Social Determinants of Health  ?  ?Financial Resource Strain: Not on file  ?Food Insecurity: Not on file  ?Transportation Needs: Not on file  ?Physical Activity: Not on file  ?Stress: Not on file  ?Social Connections: Not on file  ?Intimate Partner Violence: Not on file  ?  ?  ?Review of Systems:    ?Constitutional: No weight loss, fever or chills ?Cardiovascular: No chest pain ?Respiratory: No SOB ?Gastrointestinal: See HPI and otherwise negative ?  ? Physical Exam:  ?Vital signs: ?BP 128/80   Pulse 88   Ht 6' (1.829 m)   Wt 273 lb 3.2 oz (123.9 kg)   SpO2 93%   BMI 37.05 kg/m?   ?  ?Constitutional:   Pleasant Caucasian male appears to be in NAD, Well developed, Well nourished, alert and cooperative ?Respiratory: Respirations even and unlabored. Lungs clear to auscultation bilaterally.   No wheezes, crackles, or rhonchi.  ?Cardiovascular: Normal S1, S2. No MRG. Regular rate and rhythm. No peripheral edema, cyanosis or pallor.  ?Gastrointestinal:  Soft, nondistended, nontender. No rebound or guarding. Normal bowel sounds. No appreciable masses or hepatomegaly. ?Rectal:  Not performed.  ?Psychiatric: Oriented to person, place and time. Demonstrates good judgement and reason without abnormal affect or behaviors. ?  ?MOST RECENT LABS: ?CBC ?Labs (Brief)  ?     ?   ?Component Value Date/Time  ?  WBC 5.4 06/18/2021 1427  ?  WBC 7.1 09/15/2018 1237  ?  RBC 4.04 (L) 06/18/2021 1427  ?  RBC 5.28 09/15/2018 1237  ?  HGB 12.7 (L) 06/18/2021 1427  ?  HCT 36.8 (L) 06/18/2021 1427  ?  PLT 198 06/18/2021 1427  ?  MCV 91 06/18/2021 1427  ?  MCH 31.4 06/18/2021 1427  ?  MCH 28.6 09/15/2018 1237  ?  MCHC 34.5 06/18/2021 1427  ?  MCHC 32.7 09/15/2018 1237  ?  RDW 13.3 06/18/2021 1427  ?  LYMPHSABS 0.8 09/15/2018 1237  ?  MONOABS 0.6 09/15/2018 1237   ?  EOSABS 0.0 09/15/2018 1237  ?  BASOSABS 0.0 09/15/2018 1237  ?  ?  ?  ?CMP     ?Labs (Brief)  ?     ?   ?Component Value Date/Time  ?  NA 140 06/18/2021 1427  ?  K 3.9 06/18/2021 1427  ?  CL 103 06/18/2021 1427  ?  CO2 21 06/18/2021 1427  ?  GLUCOSE 115 (H) 06/18/2021 1427  ?  GLUCOSE 114 (H) 09/15/2018 1237  ?  BUN 16 06/18/2021 1427  ?  CREATININE 1.05 06/18/2021 1427  ?  CALCIUM 8.4 (L) 06/18/2021 1427  ?  PROT 7.1 09/15/2018 1237  ?  ALBUMIN 3.8 09/15/2018 1237  ?  AST 24 09/15/2018 1237  ?  ALT 33 09/15/2018 1237  ?  ALKPHOS 69 09/15/2018 1237  ?  BILITOT 1.1 09/15/2018 1237  ?  GFRNONAA >60 09/15/2018 1237  ?  GFRAA >60 09/15/2018 1237  ?  ?  ?  ?Assessment: ?1.  Abnormal CT of the esophagus: Back in September had a CTA for question of pulmonary embolus, this showed an abnormality in his esophagus and it was recommended he have an EGD ?2.  GERD: About once or twice a week after eating trigger foods after 6 PM; likely reactive gastritis ?  ?Plan: ?1.  Scheduled patient for diagnostic EGD in the [facility] with Dr. Lyndel Safe given abnormal CT.  Did provide the patient a detailed list of risks for procedure and he agrees to proceed. Patient is appropriate for endoscopic procedure(s) in the ambulatory ([facility]) setting.  ?2.  Discussed reflux symptoms, these are very infrequently maybe once a week and only if he eats trigger foods.  Recommend he try Pepcid 20 mg over-the-counter prior to eating something he feels like will bother him to see if this helps. ?3.  Patient follow in clinic per recommendations from Dr. Lyndel Safe after time of procedure. ?  ?Ellouise Newer, PA-C ?[facility] ? ? ? ?Reviewd ?No longer takes eliquis ?RG ?

## 2021-12-20 NOTE — Progress Notes (Signed)
Pt in recovery with monitors in place, VSS. Report given to receiving RN. Bite guard was placed with pt awake to ensure comfort. No dental or soft tissue damage noted. 

## 2021-12-20 NOTE — Progress Notes (Signed)
Pt's states no medical or surgical changes since previsit or office visit. 

## 2021-12-20 NOTE — Progress Notes (Signed)
Called to room to assist during endoscopic procedure.  Patient ID and intended procedure confirmed with present staff. Received instructions for my participation in the procedure from the performing physician.  

## 2021-12-20 NOTE — Patient Instructions (Signed)
HANDOUTS PROVIDED ON: GASTRITIS & HIATAL HERNIA ? ?The biopsies taken today have been sent for pathology.  The results can take 1-3 weeks to receive.   ? ?You may resume your previous diet and medication schedule. ? ?Begin taking Protonix 40 mg twice daily for 12 weeks then once daily indefinitely and for the next 2 weeks take Carafate 1g suspension four times a day. ? ?Thank you for allowing [place] to care for you today!!! ? ? ?YOU HAD AN ENDOSCOPIC PROCEDURE TODAY AT [facility]:   Refer to the procedure report that was given to you for any specific questions about what was found during the examination.  If the procedure report does not answer your questions, please call your gastroenterologist to clarify.  If you requested that your care partner not be given the details of your procedure findings, then the procedure report has been included in a sealed envelope for you to review at your convenience later. ? ?YOU SHOULD EXPECT: Some feelings of bloating in the abdomen. Passage of more gas than usual.  Walking can help get rid of the air that was put into your GI tract during the procedure and reduce the bloating.  ? ?Please Note:  You might notice some irritation and congestion in your nose or some drainage.  This is from the oxygen used during your procedure.  There is no need for concern and it should clear up in a day or so. ? ?SYMPTOMS TO REPORT IMMEDIATELY: ? ?Following upper endoscopy (EGD) ? Vomiting of blood or coffee ground material ? New chest pain or pain under the shoulder blades ? Painful or persistently difficult swallowing ? New shortness of breath ? Fever of 100?F or higher ? Black, tarry-looking stools ? ?For urgent or emergent issues, a gastroenterologist can be reached at any hour by calling [phone]. ?Do not use MyChart messaging for urgent concerns.  ? ? ?DIET:  We do recommend a small meal at first, but then you may proceed to your regular diet.  Drink plenty of fluids but  you should avoid alcoholic beverages for 24 hours. ? ?ACTIVITY:  You should plan to take it easy for the rest of today and you should NOT DRIVE or use heavy machinery until tomorrow (because of the sedation medicines used during the test).   ? ?FOLLOW UP: ?Our staff will call the number listed on your records Monday morning between 7:15 am and 8:15 am following your procedure to check on you and address any questions or concerns that you may have regarding the information given to you following your procedure. If we do not reach you, we will leave a message.  We will attempt to reach you two times.  During this call, we will ask if you have developed any symptoms of COVID 19. If you develop any symptoms (ie: fever, flu-like symptoms, shortness of breath, cough etc.) before then, please call [phone].  If you test positive for Covid 19 in the 2 weeks post procedure, please call and report this information to [place].   ? ?If any biopsies were taken you will be contacted by phone or by letter within the next 1-3 weeks.  Please call us at [phone] if you have not heard about the biopsies in 3 weeks.  ? ? ?SIGNATURES/CONFIDENTIALITY: ?You and/or your care partner have signed paperwork which will be entered into your electronic medical record.  These signatures attest to the fact that that the information above on your After  Visit Summary has been reviewed and is understood.  Full responsibility of the confidentiality of this discharge information lies with you and/or your care-partner. ? ?

## 2021-12-24 ENCOUNTER — Telehealth: Payer: Self-pay | Admitting: *Deleted

## 2021-12-24 NOTE — Telephone Encounter (Signed)
?  Follow up Call- ? ? ?  12/20/2021  ? 12:55 PM 12/23/2019  ?  9:58 AM  ?Call back number  ?Post procedure Call Back phone  # [phone]  ?Permission to leave phone message Yes Yes  ?  ? ?Patient questions: ? ?Do you have a fever, pain , or abdominal swelling? No. ?Pain Score  0 * ? ?Have you tolerated food without any problems? Yes.   ? ?Have you been able to return to your normal activities? Yes.   ? ?Do you have any questions about your discharge instructions: ?Diet   No. ?Medications  No. ?Follow up visit  No. ? ?Do you have questions or concerns about your Care? No. ? ?Actions: ?* If pain score is 4 or above: ?No action needed, pain <4. ? ? ?

## 2021-12-30 ENCOUNTER — Encounter: Payer: Self-pay | Admitting: Gastroenterology

## 2021-12-31 ENCOUNTER — Ambulatory Visit (INDEPENDENT_AMBULATORY_CARE_PROVIDER_SITE_OTHER): Payer: BC Managed Care – PPO | Admitting: Cardiology

## 2021-12-31 ENCOUNTER — Encounter: Payer: Self-pay | Admitting: Cardiology

## 2021-12-31 VITALS — BP 133/80 | HR 82 | Ht 72.0 in | Wt 273.0 lb

## 2021-12-31 DIAGNOSIS — I480 Paroxysmal atrial fibrillation: Secondary | ICD-10-CM | POA: Diagnosis not present

## 2021-12-31 NOTE — Progress Notes (Signed)
? ?Electrophysiology Office Note ? ? ?Date:  12/31/2021  ? ?ID:  Jon Vasquez, DOB [birth year], MRN [id number] ? ?PCP:  Angelina Sheriff, MD  ?Cardiologist:  Bettina Gavia ?Primary Electrophysiologist:  Synethia Endicott Meredith Leeds, MD   ? ?Chief Complaint: AF ?  ?History of Present Illness: ?Jon Vasquez is a 56 y.o. male who is being seen today for the evaluation of AF at the request of Angelina Sheriff, MD. Presenting today for electrophysiology evaluation. ? ?He has a history significant for atrial fibrillation.  He was seen 02/13/2021 with palpitations and atrial fibrillation.  He wore a cardiac monitor with a 2% burden.  He is status post ablation 06/27/2021. ? ?Today, denies symptoms of palpitations, chest pain, shortness of breath, orthopnea, PND, lower extremity edema, claudication, dizziness, presyncope, syncope, bleeding, or neurologic sequela. The patient is tolerating medications without difficulties.  He is noted no further episodes of atrial fibrillation.  He is overall quite happy with his control.  He continues to exercise, but does want to get back into the gym. ? ?Past Medical History:  ?Diagnosis Date  ? Arthritis   ? Bronchitis   ? Diverticulitis   ? GERD (gastroesophageal reflux disease)   ? occasional   ? Pneumonia   ? ?Past Surgical History:  ?Procedure Laterality Date  ? APPENDECTOMY    ? ATRIAL FIBRILLATION ABLATION N/A 06/27/2021  ? Procedure: ATRIAL FIBRILLATION ABLATION;  Surgeon: Constance Haw, MD;  Location: [facility];  Service: Cardiovascular;  Laterality: N/A;  ? COLONOSCOPY    ? POLYPECTOMY    ? ? ? ?Current Outpatient Medications  ?Medication Sig Dispense Refill  ? cholecalciferol (VITAMIN D3) 25 MCG (1000 UNIT) tablet Take 1,000 Units by mouth daily.    ? diclofenac (VOLTAREN) 75 MG EC tablet Take 75 mg by mouth 2 (two) times daily.    ? ELDERBERRY PO Take 1 tablet by mouth daily.    ? HYDROCODONE-ACETAMINOPHEN PO Take 1 tablet by mouth as needed.    ? Multiple Vitamin  (MULTIVITAMIN) tablet Take 1 tablet by mouth daily.    ? Omega 3 1000 MG CAPS Take 1,000 mg by mouth daily.    ? pantoprazole (PROTONIX) 40 MG tablet Take 40 mg twice daily for 12 weeks then once daily indefinitely 90 tablet 3  ? Probiotic Product (PROBIOTIC PO) Take 1 capsule by mouth daily.    ? sucralfate (CARAFATE) 1 GM/10ML suspension Take 10 mLs (1 g total) by mouth 4 (four) times daily. 420 mL 0  ? vitamin B-12 (CYANOCOBALAMIN) 1000 MCG tablet Take 2,000 mcg by mouth daily.    ? zinc gluconate 50 MG tablet Take 50 mg by mouth in the morning and at bedtime.    ? ?No current facility-administered medications for this visit.  ? ? ?Allergies:   Patient has no known allergies.  ? ?Social History:  The patient  reports that he has never smoked. He has never used smokeless tobacco. He reports that he does not drink alcohol and does not use drugs.  ? ?Family History:  The patient's family history includes Cancer in his mother; Colon polyps in his mother; Diabetes in his mother; Heart disease in his father; Lung cancer in his mother; Pulmonary fibrosis in his father.  ? ?ROS:  Please see the history of present illness.   Otherwise, review of systems is positive for none.   All other systems are reviewed and negative.  ? ?PHYSICAL EXAM: ?VS:  BP 133/80  Pulse 82   Ht 6' (1.829 m)   Wt 273 lb (123.8 kg)   SpO2 97%   BMI 37.03 kg/m?  , BMI Body mass index is 37.03 kg/m?. ?GEN: Well nourished, well developed, in no acute distress  ?HEENT: normal  ?Neck: no JVD, carotid bruits, or masses ?Cardiac: RRR; no murmurs, rubs, or gallops,no edema  ?Respiratory:  clear to auscultation bilaterally, normal work of breathing ?GI: soft, nontender, nondistended, + BS ?MS: no deformity or atrophy  ?Skin: warm and dry ?Neuro:  Strength and sensation are intact ?Psych: euthymic mood, full affect ? ?EKG:  EKG is ordered today. ?Personal review of the ekg ordered shows sinus rhythm ? ?Recent Labs: ?06/18/2021: BUN 16; Creatinine, Ser  1.05; Hemoglobin 12.7; Platelets 198; Potassium 3.9; Sodium 140  ? ? ?Lipid Panel  ?No results found for: CHOL, TRIG, HDL, CHOLHDL, VLDL, LDLCALC, LDLDIRECT ? ? ?Wt Readings from Last 3 Encounters:  ?12/31/21 273 lb (123.8 kg)  ?12/20/21 273 lb (123.8 kg)  ?12/18/21 273 lb 3.2 oz (123.9 kg)  ?  ? ? ?Other studies Reviewed: ?Additional studies/ records that were reviewed today include: Myoview 02/22/2021 ?Review of the above records today demonstrates:  ?The left ventricular ejection fraction is normal (55-65%). ?Nuclear stress EF: 64%. ?There was no ST segment deviation noted during stress. ?No T wave inversion was noted during stress. ?The study is normal. ?This is a low risk study. ? ?Cardiac monitor 02/27/2021 personally reviewed ?Atrial fibrillation was present with an A. fib burden of 2%.  There were 61 episodes 3 greater than 6 minutes the longest 47 minutes average heart rate of 160 bpm. ? ?ASSESSMENT AND PLAN: ? ?1.  Paroxysmal atrial fibrillation: Cardiac monitor with a 2% burden.  Status post ablation 09/27/2020.  CHA2DS2-VASc of 0 and thus not anticoagulated.  He has not had any further episodes of atrial fibrillation.  He is overall quite happy with his control.  We Zephaniah Enyeart continue with current management. ? ?2.  Obesity: Body mass index is 37.03 kg/m?. ?Lifestyle modification with diet and exercise encouraged ? ?Current medicines are reviewed at length with the patient today.   ?The patient does not have concerns regarding his medicines.  The following changes were made today: none ? ?Labs/ tests ordered today include:  ?No orders of the defined types were placed in this encounter. ? ? ? ?Disposition:   FU with Winford Hehn 6 months ? ?Signed, ?Mikah Poss Meredith Leeds, MD  ?12/31/2021 11:14 AM    ? ?[facility] ?[address] ?[address] ?[place] [address] ?[phone] (office) ?[phone] (fax) ? ?

## 2021-12-31 NOTE — Patient Instructions (Signed)
Medication Instructions:  ?Your physician recommends that you continue on your current medications as directed. Please refer to the Current Medication list given to you today. ? ?*If you need a refill on your cardiac medications before your next appointment, please call your pharmacy* ? ? ?Lab Work: ?None ordered. ? ?If you have labs (blood work) drawn today and your tests are completely normal, you will receive your results only by: ?MyChart Message (if you have MyChart) OR ?A paper copy in the mail ?If you have any lab test that is abnormal or we need to change your treatment, we will call you to review the results. ? ? ?Testing/Procedures: ?None ordered. ? ? ? ?Follow-Up: ?At [facility], you and your health needs are our priority.  As part of our continuing mission to provide you with exceptional heart care, we have created designated Provider Care Teams.  These Care Teams include your primary Cardiologist (physician) and Advanced Practice Providers (APPs -  Physician Assistants and Nurse Practitioners) who all work together to provide you with the care you need, when you need it. ? ?We recommend signing up for the patient portal called "MyChart".  Sign up information is provided on this After Visit Summary.  MyChart is used to connect with patients for Virtual Visits (Telemedicine).  Patients are able to view lab/test results, encounter notes, upcoming appointments, etc.  Non-urgent messages can be sent to your provider as well.   ?To learn more about what you can do with MyChart, go to [email or web address].   ? ?Your next appointment:   ?6 month(s) ? ?The format for your next appointment:   ?In Person ? ?Provider:   ?Allegra Lai, MD{ ? ?Important Information About [place] ? ? ? ? ?  ?

## 2022-01-01 NOTE — Addendum Note (Signed)
Addended by: Jeremy Johann on: 01/01/2022 11:28 AM ? ? Modules accepted: Orders ? ?

## 2022-01-02 NOTE — Progress Notes (Signed)
Agree with assessment/plan RG 

## 2022-01-05 ENCOUNTER — Other Ambulatory Visit: Payer: Self-pay | Admitting: Cardiology

## 2022-01-21 ENCOUNTER — Encounter: Payer: Self-pay | Admitting: Podiatry

## 2022-01-21 ENCOUNTER — Ambulatory Visit (INDEPENDENT_AMBULATORY_CARE_PROVIDER_SITE_OTHER): Payer: BC Managed Care – PPO | Admitting: Podiatry

## 2022-01-21 DIAGNOSIS — G5792 Unspecified mononeuropathy of left lower limb: Secondary | ICD-10-CM

## 2022-01-21 DIAGNOSIS — G5762 Lesion of plantar nerve, left lower limb: Secondary | ICD-10-CM

## 2022-01-21 NOTE — Progress Notes (Signed)
?  Subjective:  ?Patient ID: Jon Vasquez, male    DOB: 1966/02/08,   MRN: [id number] ? ?Chief Complaint  ?Patient presents with  ? Foot Pain  ?  The left foot is swelling and there is not any difference that I can tell about the same and the shots have not helped and hurts to walk and the 2nd and 3rd toes left   ? ? ?57 y.o. male presents for follow-up of left foot pain. Has seen Dr. March Rummage in the past for plantar fasciiits and neuroma on the left. Relates his pain has not been getting any better. Relates he has had injections with little relief. Relates tightness over the top of his foot and swelling. States he does have a history of back pain and issues as well as right hip arthritis. Relates those issues started about the same time as his foot pain . Denies any other pedal complaints. Denies n/v/f/c.  ? ?Past Medical History:  ?Diagnosis Date  ? Arthritis   ? Bronchitis   ? Diverticulitis   ? GERD (gastroesophageal reflux disease)   ? occasional   ? Pneumonia   ? ? ?Objective:  ?Physical Exam: ?Vascular: DP/PT pulses 2/4 bilateral. CFT <3 seconds. Normal hair growth on digits. No edema.  ?Skin. No lacerations or abrasions bilateral feet.  ?Musculoskeletal: MMT 5/5 bilateral lower extremities in DF, PF, Inversion and Eversion. Deceased ROM in DF of ankle joint.  There is positive tinels signs to palpation of superfdicial peroneal nerve. No pain to dorsum of foot. Some pain with metatarsal squeeze, but no palpable mulders click.  ?Neurological: Sensation intact to light touch.  ? ?Assessment:  ? ?1. Morton neuroma, left   ?2. Neuritis of left foot   ? ? ? ?Plan:  ?Patient was evaluated and treated and all questions answered. ?Discussed neuritis vs neuroma vs radiculopathy and etiology as well as treatment with patient.  ?-Discussed and educated patient on  foot care, especially with  ?regards to the vascular, neurological and musculoskeletal systems.  ?-Discussed supportive shoes at all times and checking feet  regularly.  ?-Follow-up with PCP for management of back and hip pain that may possibly be contirbuting.  ?-MRI ordered to evaluate for neuroma vs other cause of pain.  ?-Discussed topical creams to try including capsaicin.  ?-Patient to return following MRI.  ? ? ?Lorenda Peck, DPM  ? ? ?

## 2022-02-01 ENCOUNTER — Ambulatory Visit
Admission: RE | Admit: 2022-02-01 | Discharge: 2022-02-01 | Disposition: A | Discharge status: Home / Self Care | Payer: BC Managed Care – PPO | Source: Ambulatory Visit | Attending: Podiatry | Admitting: Podiatry

## 2022-02-01 DIAGNOSIS — G5792 Unspecified mononeuropathy of left lower limb: Secondary | ICD-10-CM

## 2022-02-01 DIAGNOSIS — M19072 Primary osteoarthritis, left ankle and foot: Secondary | ICD-10-CM | POA: Diagnosis not present

## 2022-02-01 DIAGNOSIS — G5762 Lesion of plantar nerve, left lower limb: Secondary | ICD-10-CM

## 2022-02-01 DIAGNOSIS — R600 Localized edema: Secondary | ICD-10-CM | POA: Diagnosis not present

## 2022-06-17 DIAGNOSIS — M5441 Lumbago with sciatica, right side: Secondary | ICD-10-CM | POA: Diagnosis not present

## 2022-06-17 DIAGNOSIS — M461 Sacroiliitis, not elsewhere classified: Secondary | ICD-10-CM | POA: Diagnosis not present

## 2022-06-17 DIAGNOSIS — M9903 Segmental and somatic dysfunction of lumbar region: Secondary | ICD-10-CM | POA: Diagnosis not present

## 2022-06-17 DIAGNOSIS — M5387 Other specified dorsopathies, lumbosacral region: Secondary | ICD-10-CM | POA: Diagnosis not present

## 2022-06-17 DIAGNOSIS — M9905 Segmental and somatic dysfunction of pelvic region: Secondary | ICD-10-CM | POA: Diagnosis not present

## 2022-06-18 DIAGNOSIS — M9905 Segmental and somatic dysfunction of pelvic region: Secondary | ICD-10-CM | POA: Diagnosis not present

## 2022-06-18 DIAGNOSIS — M5387 Other specified dorsopathies, lumbosacral region: Secondary | ICD-10-CM | POA: Diagnosis not present

## 2022-06-18 DIAGNOSIS — M9903 Segmental and somatic dysfunction of lumbar region: Secondary | ICD-10-CM | POA: Diagnosis not present

## 2022-06-18 DIAGNOSIS — M461 Sacroiliitis, not elsewhere classified: Secondary | ICD-10-CM | POA: Diagnosis not present

## 2022-06-18 DIAGNOSIS — M5441 Lumbago with sciatica, right side: Secondary | ICD-10-CM | POA: Diagnosis not present

## 2022-06-19 ENCOUNTER — Other Ambulatory Visit: Payer: Self-pay | Admitting: Gastroenterology

## 2022-06-19 DIAGNOSIS — K2970 Gastritis, unspecified, without bleeding: Secondary | ICD-10-CM

## 2022-06-19 DIAGNOSIS — K219 Gastro-esophageal reflux disease without esophagitis: Secondary | ICD-10-CM

## 2022-06-19 DIAGNOSIS — K449 Diaphragmatic hernia without obstruction or gangrene: Secondary | ICD-10-CM

## 2022-06-21 DIAGNOSIS — M461 Sacroiliitis, not elsewhere classified: Secondary | ICD-10-CM | POA: Diagnosis not present

## 2022-06-21 DIAGNOSIS — M9903 Segmental and somatic dysfunction of lumbar region: Secondary | ICD-10-CM | POA: Diagnosis not present

## 2022-06-21 DIAGNOSIS — M5387 Other specified dorsopathies, lumbosacral region: Secondary | ICD-10-CM | POA: Diagnosis not present

## 2022-06-21 DIAGNOSIS — M9905 Segmental and somatic dysfunction of pelvic region: Secondary | ICD-10-CM | POA: Diagnosis not present

## 2022-06-21 DIAGNOSIS — M5441 Lumbago with sciatica, right side: Secondary | ICD-10-CM | POA: Diagnosis not present

## 2022-06-24 DIAGNOSIS — M461 Sacroiliitis, not elsewhere classified: Secondary | ICD-10-CM | POA: Diagnosis not present

## 2022-06-24 DIAGNOSIS — M9905 Segmental and somatic dysfunction of pelvic region: Secondary | ICD-10-CM | POA: Diagnosis not present

## 2022-06-24 DIAGNOSIS — M5441 Lumbago with sciatica, right side: Secondary | ICD-10-CM | POA: Diagnosis not present

## 2022-06-24 DIAGNOSIS — M5387 Other specified dorsopathies, lumbosacral region: Secondary | ICD-10-CM | POA: Diagnosis not present

## 2022-06-27 DIAGNOSIS — M5441 Lumbago with sciatica, right side: Secondary | ICD-10-CM | POA: Diagnosis not present

## 2022-06-27 DIAGNOSIS — M9905 Segmental and somatic dysfunction of pelvic region: Secondary | ICD-10-CM | POA: Diagnosis not present

## 2022-06-27 DIAGNOSIS — M5387 Other specified dorsopathies, lumbosacral region: Secondary | ICD-10-CM | POA: Diagnosis not present

## 2022-06-27 DIAGNOSIS — M461 Sacroiliitis, not elsewhere classified: Secondary | ICD-10-CM | POA: Diagnosis not present

## 2022-06-28 DIAGNOSIS — M5387 Other specified dorsopathies, lumbosacral region: Secondary | ICD-10-CM | POA: Diagnosis not present

## 2022-06-28 DIAGNOSIS — M5441 Lumbago with sciatica, right side: Secondary | ICD-10-CM | POA: Diagnosis not present

## 2022-06-28 DIAGNOSIS — M9905 Segmental and somatic dysfunction of pelvic region: Secondary | ICD-10-CM | POA: Diagnosis not present

## 2022-06-28 DIAGNOSIS — M461 Sacroiliitis, not elsewhere classified: Secondary | ICD-10-CM | POA: Diagnosis not present

## 2022-07-05 DIAGNOSIS — M9905 Segmental and somatic dysfunction of pelvic region: Secondary | ICD-10-CM | POA: Diagnosis not present

## 2022-07-05 DIAGNOSIS — M461 Sacroiliitis, not elsewhere classified: Secondary | ICD-10-CM | POA: Diagnosis not present

## 2022-07-05 DIAGNOSIS — M5387 Other specified dorsopathies, lumbosacral region: Secondary | ICD-10-CM | POA: Diagnosis not present

## 2022-07-05 DIAGNOSIS — M5441 Lumbago with sciatica, right side: Secondary | ICD-10-CM | POA: Diagnosis not present

## 2022-07-08 DIAGNOSIS — M461 Sacroiliitis, not elsewhere classified: Secondary | ICD-10-CM | POA: Diagnosis not present

## 2022-07-08 DIAGNOSIS — M5441 Lumbago with sciatica, right side: Secondary | ICD-10-CM | POA: Diagnosis not present

## 2022-07-08 DIAGNOSIS — M5387 Other specified dorsopathies, lumbosacral region: Secondary | ICD-10-CM | POA: Diagnosis not present

## 2022-07-08 DIAGNOSIS — M9905 Segmental and somatic dysfunction of pelvic region: Secondary | ICD-10-CM | POA: Diagnosis not present

## 2022-07-15 DIAGNOSIS — M5387 Other specified dorsopathies, lumbosacral region: Secondary | ICD-10-CM | POA: Diagnosis not present

## 2022-07-15 DIAGNOSIS — E7800 Pure hypercholesterolemia, unspecified: Secondary | ICD-10-CM | POA: Diagnosis not present

## 2022-07-15 DIAGNOSIS — Z6834 Body mass index (BMI) 34.0-34.9, adult: Secondary | ICD-10-CM | POA: Diagnosis not present

## 2022-07-15 DIAGNOSIS — M9905 Segmental and somatic dysfunction of pelvic region: Secondary | ICD-10-CM | POA: Diagnosis not present

## 2022-07-15 DIAGNOSIS — M25551 Pain in right hip: Secondary | ICD-10-CM | POA: Diagnosis not present

## 2022-07-15 DIAGNOSIS — Z2821 Immunization not carried out because of patient refusal: Secondary | ICD-10-CM | POA: Diagnosis not present

## 2022-07-15 DIAGNOSIS — Z125 Encounter for screening for malignant neoplasm of prostate: Secondary | ICD-10-CM | POA: Diagnosis not present

## 2022-07-15 DIAGNOSIS — M461 Sacroiliitis, not elsewhere classified: Secondary | ICD-10-CM | POA: Diagnosis not present

## 2022-07-15 DIAGNOSIS — Z23 Encounter for immunization: Secondary | ICD-10-CM | POA: Diagnosis not present

## 2022-07-15 DIAGNOSIS — M5441 Lumbago with sciatica, right side: Secondary | ICD-10-CM | POA: Diagnosis not present

## 2022-07-18 ENCOUNTER — Telehealth: Payer: Self-pay | Admitting: *Deleted

## 2022-07-18 DIAGNOSIS — M1611 Unilateral primary osteoarthritis, right hip: Secondary | ICD-10-CM | POA: Diagnosis not present

## 2022-07-18 NOTE — Telephone Encounter (Signed)
Pt agreeable to plan of care for tele pre op appt 08/05/22 @ 10:20. Med rec and consent are done.     Patient Consent for Virtual Visit        Jon Vasquez has provided verbal consent on 07/18/2022 for a virtual visit (video or telephone).   CONSENT FOR VIRTUAL VISIT FOR:  Jon Vasquez  By participating in this virtual visit I agree to the following:  I hereby voluntarily request, consent and authorize [facility] and its employed or contracted physicians, physician assistants, nurse practitioners or other licensed health care professionals (the Practitioner), to provide me with telemedicine health care services (the "Services") as deemed necessary by the treating Practitioner. I acknowledge and consent to receive the Services by the Practitioner via telemedicine. I understand that the telemedicine visit will involve communicating with the Practitioner through live audiovisual communication technology and the disclosure of certain medical information by electronic transmission. I acknowledge that I have been given the opportunity to request an in-person assessment or other available alternative prior to the telemedicine visit and am voluntarily participating in the telemedicine visit.  I understand that I have the right to withhold or withdraw my consent to the use of telemedicine in the course of my care at any time, without affecting my right to future care or treatment, and that the Practitioner or I may terminate the telemedicine visit at any time. I understand that I have the right to inspect all information obtained and/or recorded in the course of the telemedicine visit and may receive copies of available information for a reasonable fee.  I understand that some of the potential risks of receiving the Services via telemedicine include:  Delay or interruption in medical evaluation due to technological equipment failure or disruption; Information transmitted may not be  sufficient (e.g. poor resolution of images) to allow for appropriate medical decision making by the Practitioner; and/or  In rare instances, security protocols could fail, causing a breach of personal health information.  Furthermore, I acknowledge that it is my responsibility to provide information about my medical history, conditions and care that is complete and accurate to the best of my ability. I acknowledge that Practitioner's advice, recommendations, and/or decision may be based on factors not within their control, such as incomplete or inaccurate data provided by me or distortions of diagnostic images or specimens that may result from electronic transmissions. I understand that the practice of medicine is not an exact science and that Practitioner makes no warranties or guarantees regarding treatment outcomes. I acknowledge that a copy of this consent can be made available to me via my patient portal ([facility]), or I can request a printed copy by calling the office of [facility].    I understand that my insurance will be billed for this visit.   I have read or had this consent read to me. I understand the contents of this consent, which adequately explains the benefits and risks of the Services being provided via telemedicine.  I have been provided ample opportunity to ask questions regarding this consent and the Services and have had my questions answered to my satisfaction. I give my informed consent for the services to be provided through the use of telemedicine in my medical care

## 2022-07-18 NOTE — Telephone Encounter (Signed)
Pt agreeable to plan of care for tele pre op appt 08/05/22 @ 10:20. Med rec and consent are done.   Pt will keep his 08/2022 appt with Dr. Curt Bears as well.

## 2022-07-18 NOTE — Telephone Encounter (Signed)
   Pre-operative Risk Assessment    Patient Name: Jon Vasquez  DOB: [birth year] MRN: [id number]      Request for Surgical Clearance    Procedure:   RIGHT TOTAL HIP ARTHOPLASTY  Date of Surgery:  Clearance 08/21/22                                 Surgeon:  DR. DANIEL CAFFREY Surgeon's Group or Practice Name:  [facility] Phone number:  [phone] EXT [phone] ATTN: [place] Fax number:  [phone]   Type of Clearance Requested:   - Medical ; NO MEDICATIONS LISTED AS NEEDING TO BE HELD   Type of Anesthesia:  Spinal   Additional requests/questions:    Jiles Prows   07/18/2022, 3:07 PM

## 2022-07-18 NOTE — Telephone Encounter (Signed)
   Name: Jon Vasquez  DOB: 07/10/1966  MRN: [id number]  Primary Cardiologist: None  Chart reviewed as part of pre-operative protocol coverage. Because of [name]'s past medical history and time since last visit, he will require a follow-up phone visit in order to better assess preoperative cardiovascular risk.  Pre-op covering staff: - Please schedule appointment and call patient to inform them. If patient already had an upcoming appointment within acceptable timeframe, please add "pre-op clearance" to the appointment notes so provider is aware. - Please contact requesting surgeon's office via preferred method (i.e, phone, fax) to inform them of need for appointment prior to surgery.  Not on antiplatelet or anticoagulant that would need held.   Loel Dubonnet, NP  07/18/2022, 3:23 PM

## 2022-07-22 DIAGNOSIS — M5441 Lumbago with sciatica, right side: Secondary | ICD-10-CM | POA: Diagnosis not present

## 2022-07-22 DIAGNOSIS — M9905 Segmental and somatic dysfunction of pelvic region: Secondary | ICD-10-CM | POA: Diagnosis not present

## 2022-07-22 DIAGNOSIS — M461 Sacroiliitis, not elsewhere classified: Secondary | ICD-10-CM | POA: Diagnosis not present

## 2022-07-22 DIAGNOSIS — M5387 Other specified dorsopathies, lumbosacral region: Secondary | ICD-10-CM | POA: Diagnosis not present

## 2022-07-25 DIAGNOSIS — M1611 Unilateral primary osteoarthritis, right hip: Secondary | ICD-10-CM | POA: Diagnosis not present

## 2022-07-25 DIAGNOSIS — D649 Anemia, unspecified: Secondary | ICD-10-CM | POA: Diagnosis not present

## 2022-07-26 ENCOUNTER — Ambulatory Visit: Payer: Self-pay | Admitting: Licensed Clinical Social Worker

## 2022-07-26 NOTE — Patient Outreach (Signed)
  Care Coordination   07/26/2022 Name: Jon Vasquez MRN: [id number] DOB: 05-May-1966   Care Coordination Outreach Attempts:  An unsuccessful telephone outreach was attempted today to offer the patient information about available care coordination services as a benefit of their health plan.   Follow Up Plan:  Additional outreach attempts will be made to offer the patient care coordination information and services.   Encounter Outcome:  No Answer  Care Coordination Interventions Activated:  No   Care Coordination Interventions:  No, not indicated    Milus Height, BSW, MSW, [place]  Social Worker [facility] Care Management  [phone]

## 2022-08-01 ENCOUNTER — Telehealth: Payer: Self-pay

## 2022-08-01 NOTE — Patient Outreach (Signed)
  Care Coordination   Initial Visit Note   08/01/2022 Name: Jon Vasquez MRN: [id number] DOB: 1966-03-20  Jon Vasquez is a 56 y.o. year old male who sees [place], Angelique Blonder, MD for primary care. I spoke with  Jon Vasquez by phone today.  What matters to the patients health and wellness today?  Placed call to patient today and reviewed [place] coordination program. Patient declines needs.      SDOH assessments and interventions completed:  No     Care Coordination Interventions Activated:  No  Care Coordination Interventions:  No, not indicated   Follow up plan: No further intervention required.   Encounter Outcome:  No Answer   Tomasa Rand, RN, BSN, CEN [facility] Coordinator [phone]

## 2022-08-05 ENCOUNTER — Inpatient Hospital Stay: Payer: BC Managed Care – PPO | Attending: Oncology | Admitting: Oncology

## 2022-08-05 ENCOUNTER — Ambulatory Visit: Payer: BC Managed Care – PPO | Attending: Cardiovascular Disease | Admitting: Physician Assistant

## 2022-08-05 ENCOUNTER — Encounter: Payer: Self-pay | Admitting: Oncology

## 2022-08-05 VITALS — BP 141/81 | HR 79 | Temp 97.4°F | Resp 14 | Ht 71.8 in | Wt 268.3 lb

## 2022-08-05 DIAGNOSIS — D709 Neutropenia, unspecified: Secondary | ICD-10-CM | POA: Insufficient documentation

## 2022-08-05 DIAGNOSIS — Z01810 Encounter for preprocedural cardiovascular examination: Secondary | ICD-10-CM | POA: Diagnosis not present

## 2022-08-05 DIAGNOSIS — Z79899 Other long term (current) drug therapy: Secondary | ICD-10-CM | POA: Insufficient documentation

## 2022-08-05 DIAGNOSIS — K219 Gastro-esophageal reflux disease without esophagitis: Secondary | ICD-10-CM | POA: Insufficient documentation

## 2022-08-05 DIAGNOSIS — D649 Anemia, unspecified: Secondary | ICD-10-CM | POA: Insufficient documentation

## 2022-08-05 DIAGNOSIS — Z9289 Personal history of other medical treatment: Secondary | ICD-10-CM

## 2022-08-05 DIAGNOSIS — I4891 Unspecified atrial fibrillation: Secondary | ICD-10-CM | POA: Insufficient documentation

## 2022-08-05 DIAGNOSIS — D72819 Decreased white blood cell count, unspecified: Secondary | ICD-10-CM | POA: Insufficient documentation

## 2022-08-05 NOTE — Progress Notes (Signed)
Virtual Visit via Telephone Note   Because of Jon Vasquez's co-morbid illnesses, he is at least at moderate risk for complications without adequate follow up.  This format is felt to be most appropriate for this patient at this time.  The patient did not have access to video technology/had technical difficulties with video requiring transitioning to audio format only (telephone).  All issues noted in this document were discussed and addressed.  No physical exam could be performed with this format.  Please refer to the patient's chart for his consent to telehealth for [facility].  Evaluation Performed:  Preoperative cardiovascular risk assessment _____________   Date:  08/05/2022   Patient ID:  Jon Vasquez, DOB Sep 08, 1966, MRN [id number] Patient Location:  Home Provider location:   Office  Primary Care Provider:  Angelina Sheriff, MD Primary Cardiologist:  None  Chief Complaint / Patient Profile   56 y.o. y/o male with a h/o atrial fibrillation, GERD, and arthritis who is pending right total arthoplasty and presents today for telephonic preoperative cardiovascular risk assessment.  Past Medical History    Past Medical History:  Diagnosis Date   Arthritis    Bronchitis    Diverticulitis    GERD (gastroesophageal reflux disease)    occasional    Pneumonia    Past Surgical History:  Procedure Laterality Date   APPENDECTOMY     ATRIAL FIBRILLATION ABLATION N/A 06/27/2021   Procedure: ATRIAL FIBRILLATION ABLATION;  Surgeon: Constance Haw, MD;  Location: [place] [facility];  Service: Cardiovascular;  Laterality: N/A;   COLONOSCOPY     POLYPECTOMY      Allergies  No Known Allergies  History of Present Illness    Jon Vasquez is a 56 y.o. male who presents via audio/video conferencing for a telehealth visit today.  Pt was last seen in cardiology clinic on 12/31/21 by Dr. Curt Bears.  At that time Jon Vasquez was doing well.  The patient is now  pending procedure as outlined above. Since his last visit, he has been feeling well without any chest pains, palpitations, or SOB.  He has not had any further episodes of atrial fibrillation to his knowledge.  He has remained active by going to the gym and using the elliptical for 30 minutes at a time.  He also does 100 push-ups per day.  His hip does limit him from running which she used to enjoy doing.  He scored a 5.60 METS on the DASI.  This exceeds the 4 METS minimum requirement.  There are no medications indicated as needing help.  He is not currently taking aspirin but will need to take an aspirin a day following surgery per the patient.  Home Medications    Prior to Admission medications   Medication Sig Start Date End Date Taking? Authorizing Provider  cholecalciferol (VITAMIN D3) 25 MCG (1000 UNIT) tablet Take 1,000 Units by mouth daily.    [provider]  diclofenac (VOLTAREN) 75 MG EC tablet Take 75 mg by mouth 2 (two) times daily. 07/05/21   [provider]  ELDERBERRY PO Take 1 tablet by mouth daily.    [provider]  HYDROCODONE-ACETAMINOPHEN PO Take 1 tablet by mouth as needed.    [provider]  Multiple Vitamin (MULTIVITAMIN) tablet Take 1 tablet by mouth daily.    [provider]  Omega 3 1000 MG CAPS Take 1,000 mg by mouth daily.    [provider]  pantoprazole (Bandana)  40 MG tablet TAKE 1 TABLET BY MOUTH TWICE A DAY FOR 12 WEEKS THEN TAKE 1 TABLET DAILY INDEFINITELY 06/19/22   Jackquline Denmark, MD  Probiotic Product (PROBIOTIC PO) Take 1 capsule by mouth daily.    [provider]  sucralfate (CARAFATE) 1 GM/10ML suspension Take 10 mLs (1 g total) by mouth 4 (four) times daily. 12/20/21   Jackquline Denmark, MD  vitamin B-12 (CYANOCOBALAMIN) 1000 MCG tablet Take 2,000 mcg by mouth daily.    [provider]  zinc gluconate 50 MG tablet Take 50 mg by mouth in the morning and at bedtime.    [provider]    Physical Exam    Vital Signs:  Jon Vasquez does not have vital signs available for review today.  Given telephonic nature of communication, physical exam is limited. AAOx3. NAD. Normal affect.  Speech and respirations are unlabored.  Accessory Clinical Findings    None  Assessment & Plan    1.  Preoperative Cardiovascular Risk Assessment:  Jon Vasquez's perioperative risk of a major cardiac event is 0.4% according to the Revised Cardiac Risk Index (RCRI).  Therefore, he is at low risk for perioperative complications.   His functional capacity is good at 5.62 METs according to the [place] Activity Status Index (DASI). Recommendations: According to ACC/AHA guidelines, no further cardiovascular testing needed.  The patient may proceed to surgery at acceptable risk.     A copy of this note will be routed to requesting surgeon.  Time:   Today, I have spent 10 minutes with the patient with telehealth technology discussing medical history, symptoms, and management plan.     Elgie Collard, PA-C  08/05/2022, 10:25 AM

## 2022-08-05 NOTE — Progress Notes (Unsigned)
[facility] Initial Visit:  Patient Care Team: Angelina Sheriff, MD as PCP - General (Family Medicine)  CHIEF COMPLAINTS/PURPOSE OF CONSULTATION:  Oncology History   No history exists.    HISTORY OF PRESENTING ILLNESS: Jon Vasquez 56 y.o. male is here because of leukopenia and anemia  Medical history notable for arthritis, diverticulitis, GERD, atrial fibrillation   June 16, 2018: WBC 7.1 hemoglobin 15.1 platelet count 219; 80 segs 11 lymphs 8 monos 1 EO  December 23 2019:  Colonoscopy demonstrated a diminutive colonic polyp status post polypectomy and mild pancolonic diverticulosis and otherwise normal. Pathology showed benign mucosa.    June 20, 2021: WBC 5.4 hemoglobin 12.7 MCV 91 platelet count 198  December 20 2021:  EGD--Esophageal mucosal changes suspicious for long-segment Barrett's esophagus with LA grade D  esophagitis and wide open esophageal stricture. Biopsied.    5 cm hiatal hernia.    Gastritis. Biopsied.    Normal examined duodenum.  Pathology 1. Surgical [P], gastric antrum and gastric body - mild gastritis.  FRAGMENTS OF GASTRIC MUCOSA WITH NO SIGNIFICANT MICROSCOPIC ABNORMALITIES. H. PYLORI, INTESTINAL METAPLASIA, ATROPHY AND DYSPLASIA ARE NOT IDENTIFIED. 2. Surgical [P], mid esophagus and distal esophagus.  FOCAL GOBLET CELL METAPLASIA AND NONSPECIFIC INFLAMMATION IN POLYPOID FRAGMENTS OF GASTRIC MUCOSA CONSISTENT WITH BARRETT'S ESOPHAGUS. NORMAL ESOPHAGEAL MUCOSA IS NOT IDENTIFIED  July 25 2022: WBC 3.3 hemoglobin 12.6 platelet count 246; 58 segs 35 lymphs.  ANC 1.9 ALC 1.1 Ferritin 163. SPEP showed no paraprotein  August 05 2022:  [facility] Consult  Five months ago did 4 sessions of plasmapheresis for treatment of arthritis.  Last session was first week of September 2023.  The procedure did not help his arthritis.  He was hoping to avoid hip surgery.    For hip replacement on August 21 2022  Social:   Separated.  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  Tobacco none.  EtOH none  [place] Mother died 11 lung cancer Father died 57 pulmonary fibrosis and heart disease Brother alive 56 asthma   Review of Systems  Constitutional:  Negative for appetite change, chills, fatigue, fever and unexpected weight change.  HENT:   Negative for mouth sores, nosebleeds, sore throat, tinnitus, trouble swallowing and voice change.   Eyes:  Negative for eye problems and icterus.       Vision changes:  None  Respiratory:  Negative for chest tightness, cough, hemoptysis, shortness of breath and wheezing.        PND:  none Orthopnea:  none DOE:    Cardiovascular:  Negative for chest pain, leg swelling and palpitations.       PND:  none Orthopnea:  none  Gastrointestinal:  Negative for abdominal pain, blood in stool, constipation, diarrhea, nausea and vomiting.  Endocrine: Negative for hot flashes.       Cold intolerance:  none Heat intolerance:  none  Genitourinary:  Negative for bladder incontinence, difficulty urinating, dysuria, frequency, hematuria and nocturia.   Musculoskeletal:  Positive for arthralgias and gait problem. Negative for back pain, myalgias, neck pain and neck stiffness.       Arthralgias in hip right hip.    Skin:  Negative for itching, rash and wound.  Neurological:  Positive for gait problem. Negative for dizziness, extremity weakness, headaches, light-headedness and numbness.       Has plantar fascitis left foot  Hematological:  Negative for adenopathy. Does not bruise/bleed easily.  Psychiatric/Behavioral:  Negative for sleep disturbance and suicidal  ideas. The patient is not nervous/anxious.     MEDICAL HISTORY: Past Medical History:  Diagnosis Date   Arthritis    Bronchitis    Diverticulitis    GERD (gastroesophageal reflux disease)    occasional    Pneumonia     SURGICAL HISTORY: Past Surgical History:  Procedure Laterality Date   APPENDECTOMY     ATRIAL FIBRILLATION  ABLATION N/A 06/27/2021   Procedure: ATRIAL FIBRILLATION ABLATION;  Surgeon: Constance Haw, MD;  Location: [place] [facility];  Service: Cardiovascular;  Laterality: N/A;   COLONOSCOPY     HERNIA REPAIR     Double Hernia   POLYPECTOMY      SOCIAL HISTORY: Social History   Socioeconomic History   Marital status: Legally Separated    Spouse name: Not on file   Number of children: 2   Years of education: 13   Highest education level: Some college, no degree  Occupational History   Not on file  Tobacco Use   Smoking status: Never   Smokeless tobacco: Never  Vaping Use   Vaping Use: Never used  Substance and Sexual Activity   Alcohol use: No   Drug use: No   Sexual activity: Not on file  Other Topics Concern   Not on file  Social History Narrative   Not on file   Social Determinants of Health   Financial Resource Strain: Not on file  Food Insecurity: Not on file  Transportation Needs: Not on file  Physical Activity: Not on file  Stress: Not on file  Social Connections: Not on file  Intimate Partner Violence: Not on file    FAMILY HISTORY Family History  Problem Relation Age of Onset   Lung cancer Mother    Colon polyps Mother    Heart disease Father    Pulmonary fibrosis Father    Colon cancer Neg Hx    Esophageal cancer Neg Hx    Rectal cancer Neg Hx    Stomach cancer Neg Hx     ALLERGIES:  has No Known Allergies.  MEDICATIONS:  Current Outpatient Medications  Medication Sig Dispense Refill   amoxicillin (AMOXIL) 500 MG tablet Take 500 mg by mouth 3 (three) times daily.     cholecalciferol (VITAMIN D3) 25 MCG (1000 UNIT) tablet Take 1,000 Units by mouth daily.     diclofenac (VOLTAREN) 75 MG EC tablet Take 75 mg by mouth 2 (two) times daily.     ELDERBERRY PO Take 1 tablet by mouth daily.     HYDROCODONE-ACETAMINOPHEN PO Take 1 tablet by mouth as needed.     Multiple Vitamin (MULTIVITAMIN) tablet Take 1 tablet by mouth daily.     Omega 3 1000 MG  CAPS Take 1,000 mg by mouth daily.     pantoprazole (PROTONIX) 40 MG tablet TAKE 1 TABLET BY MOUTH TWICE A DAY FOR 12 WEEKS THEN TAKE 1 TABLET DAILY INDEFINITELY 180 tablet 1   Probiotic Product (PROBIOTIC PO) Take 1 capsule by mouth daily.     vitamin B-12 (CYANOCOBALAMIN) 1000 MCG tablet Take 2,000 mcg by mouth daily.     zinc gluconate 50 MG tablet Take 50 mg by mouth in the morning and at bedtime.     No current facility-administered medications for this visit.    PHYSICAL EXAMINATION:  ECOG PERFORMANCE STATUS: {CHL ONC ECOG YO:[id number]}   Vitals:   08/05/22 1620  BP: (!) 141/81  Pulse: 79  Resp: 14  Temp: (!) 97.4 F (36.3 C)  SpO2:  96%    Filed Weights   08/05/22 1620  Weight: 268 lb 4.8 oz (121.7 kg)     Physical Exam Vitals and nursing note reviewed.  Constitutional:      Appearance: Normal appearance. He is not diaphoretic.     Comments: Here alone.    HENT:     Head: Normocephalic and atraumatic.     Right Ear: External ear normal.     Left Ear: External ear normal.     Nose: Nose normal.  Eyes:     General: No scleral icterus.    Conjunctiva/sclera: Conjunctivae normal.     Pupils: Pupils are equal, round, and reactive to light.  Cardiovascular:     Rate and Rhythm: Normal rate and regular rhythm.     Heart sounds:     No friction rub. No gallop.  Pulmonary:     Effort: Pulmonary effort is normal. No respiratory distress.     Breath sounds: Normal breath sounds. No stridor. No wheezing or rhonchi.  Abdominal:     Tenderness: There is no abdominal tenderness. There is no guarding or rebound.  Musculoskeletal:     Cervical back: Normal range of motion and neck supple. No rigidity or tenderness.     Comments: Ambulates with single point cane  Lymphadenopathy:     Head:     Right side of head: No submental, submandibular, tonsillar, preauricular, posterior auricular or occipital adenopathy.     Left side of head: No submental, submandibular,  tonsillar, preauricular, posterior auricular or occipital adenopathy.     Cervical: No cervical adenopathy.     Right cervical: No superficial, deep or posterior cervical adenopathy.    Left cervical: No superficial, deep or posterior cervical adenopathy.     Upper Body:     Right upper body: No supraclavicular or axillary adenopathy.     Left upper body: No supraclavicular or axillary adenopathy.     Lower Body: No right inguinal adenopathy. No left inguinal adenopathy.  Skin:    Coloration: Skin is not jaundiced.  Neurological:     General: No focal deficit present.     Mental Status: He is alert and oriented to person, place, and time. Mental status is at baseline.  Psychiatric:        Mood and Affect: Mood normal.        Behavior: Behavior normal.        Thought Content: Thought content normal.        Judgment: Judgment normal.      LABORATORY DATA: I have personally reviewed the data as listed:  No visits with results within 1 Month(s) from this visit.  Latest known visit with results is:  Admission on 06/27/2021, Discharged on 06/27/2021  Component Date Value Ref Range Status   Activated Clotting Time 06/27/2021 242  seconds Final   Reference range 74-137 seconds for patients not on anticoagulant therapy.   Activated Clotting Time 06/27/2021 277  seconds Final   Reference range 74-137 seconds for patients not on anticoagulant therapy.   Activated Clotting Time 06/27/2021 329  seconds Final   Reference range 74-137 seconds for patients not on anticoagulant therapy.   Activated Clotting Time 06/27/2021 306  seconds Final   Reference range 74-137 seconds for patients not on anticoagulant therapy.    RADIOGRAPHIC STUDIES: I have personally reviewed the radiological images as listed and agree with the findings in the report  No results found.  ASSESSMENT/PLAN  Cancer Staging  No matching staging  information was found for the patient.   No problem-specific Assessment  & Plan notes found for this encounter.   Orders Placed This Encounter  Procedures   Copper, serum    Standing Status:   Future    Standing Expiration Date:   08/06/2023   Ferritin    Standing Status:   Future    Standing Expiration Date:   08/06/2023   Folate    Standing Status:   Future    Standing Expiration Date:   08/06/2023   Haptoglobin    Standing Status:   Future    Standing Expiration Date:   08/06/2023   Kappa/lambda light chains    Standing Status:   Future    Standing Expiration Date:   08/06/2023   Vitamin B12    Standing Status:   Future    Standing Expiration Date:   08/06/2023   Multiple Myeloma Panel (SPEP&IFE w/QIG)    Standing Status:   Future    Standing Expiration Date:   08/06/2023   Pathologist smear review    Standing Status:   Future    Standing Expiration Date:   08/06/2023   Reticulocytes    Standing Status:   Future    Standing Expiration Date:   08/06/2023   Zinc    Standing Status:   Future    Standing Expiration Date:   08/06/2023   ANA Comprehensive Panel    Standing Status:   Future    Standing Expiration Date:   08/06/2023   CBC with Differential/Platelet   Comprehensive metabolic panel   Rheumatoid factor    Standing Status:   Future    Standing Expiration Date:   08/06/2023   Hepatitis panel, acute   Direct antiglobulin test (not at [facility])    Standing Status:   Future    Standing Expiration Date:   08/06/2023    All questions were answered. The patient knows to call the clinic with any problems, questions or concerns.  This note was electronically signed.    Barbee Cough, MD  08/05/2022 4:28 PM

## 2022-08-06 ENCOUNTER — Encounter: Payer: Self-pay | Admitting: Oncology

## 2022-08-06 ENCOUNTER — Inpatient Hospital Stay: Payer: BC Managed Care – PPO

## 2022-08-06 DIAGNOSIS — M5387 Other specified dorsopathies, lumbosacral region: Secondary | ICD-10-CM | POA: Diagnosis not present

## 2022-08-06 DIAGNOSIS — K219 Gastro-esophageal reflux disease without esophagitis: Secondary | ICD-10-CM | POA: Diagnosis not present

## 2022-08-06 DIAGNOSIS — Z79899 Other long term (current) drug therapy: Secondary | ICD-10-CM | POA: Diagnosis not present

## 2022-08-06 DIAGNOSIS — D649 Anemia, unspecified: Secondary | ICD-10-CM | POA: Diagnosis not present

## 2022-08-06 DIAGNOSIS — I4891 Unspecified atrial fibrillation: Secondary | ICD-10-CM | POA: Diagnosis not present

## 2022-08-06 DIAGNOSIS — D709 Neutropenia, unspecified: Secondary | ICD-10-CM

## 2022-08-06 DIAGNOSIS — M5441 Lumbago with sciatica, right side: Secondary | ICD-10-CM | POA: Diagnosis not present

## 2022-08-06 DIAGNOSIS — M461 Sacroiliitis, not elsewhere classified: Secondary | ICD-10-CM | POA: Diagnosis not present

## 2022-08-06 DIAGNOSIS — M9905 Segmental and somatic dysfunction of pelvic region: Secondary | ICD-10-CM | POA: Diagnosis not present

## 2022-08-06 DIAGNOSIS — D72819 Decreased white blood cell count, unspecified: Secondary | ICD-10-CM | POA: Diagnosis not present

## 2022-08-06 LAB — HEPATITIS PANEL, ACUTE
HCV Ab: NONREACTIVE
Hep A IgM: NONREACTIVE
Hep B C IgM: NONREACTIVE
Hepatitis B Surface Ag: NONREACTIVE

## 2022-08-06 LAB — CBC WITH DIFFERENTIAL/PLATELET
Abs Immature Granulocytes: 0.02 10*3/uL (ref 0.00–0.07)
Basophils Absolute: 0 10*3/uL (ref 0.0–0.1)
Basophils Relative: 0 %
Eosinophils Absolute: 0 10*3/uL (ref 0.0–0.5)
Eosinophils Relative: 1 %
HCT: 35.3 % — ABNORMAL LOW (ref 39.0–52.0)
Hemoglobin: 11.8 g/dL — ABNORMAL LOW (ref 13.0–17.0)
Immature Granulocytes: 1 %
Lymphocytes Relative: 41 %
Lymphs Abs: 1.7 10*3/uL (ref 0.7–4.0)
MCH: 31.1 pg (ref 26.0–34.0)
MCHC: 33.4 g/dL (ref 30.0–36.0)
MCV: 92.9 fL (ref 80.0–100.0)
Monocytes Absolute: 0.3 10*3/uL (ref 0.1–1.0)
Monocytes Relative: 8 %
Neutro Abs: 2 10*3/uL (ref 1.7–7.7)
Neutrophils Relative %: 49 %
Platelets: 254 10*3/uL (ref 150–400)
RBC: 3.8 MIL/uL — ABNORMAL LOW (ref 4.22–5.81)
RDW: 12.3 % (ref 11.5–15.5)
WBC: 4.1 10*3/uL (ref 4.0–10.5)
nRBC: 0 % (ref 0.0–0.2)

## 2022-08-06 LAB — COMPREHENSIVE METABOLIC PANEL
ALT: 36 U/L (ref 0–44)
AST: 21 U/L (ref 15–41)
Albumin: 4.2 g/dL (ref 3.5–5.0)
Alkaline Phosphatase: 94 U/L (ref 38–126)
Anion gap: 7 (ref 5–15)
BUN: 19 mg/dL (ref 6–20)
CO2: 25 mmol/L (ref 22–32)
Calcium: 8.7 mg/dL — ABNORMAL LOW (ref 8.9–10.3)
Chloride: 107 mmol/L (ref 98–111)
Creatinine, Ser: 1.01 mg/dL (ref 0.61–1.24)
GFR, Estimated: >60 mL/min (ref >60)
Glucose, Bld: 95 mg/dL (ref 70–99)
Potassium: 3.8 mmol/L (ref 3.5–5.1)
Sodium: 139 mmol/L (ref 135–145)
Total Bilirubin: 0.4 mg/dL (ref 0.3–1.2)
Total Protein: 7.2 g/dL (ref 6.5–8.1)

## 2022-08-06 LAB — RETICULOCYTES
Immature Retic Fract: 14.7 % (ref 2.3–15.9)
RBC.: 3.74 MIL/uL — ABNORMAL LOW (ref 4.22–5.81)
Retic Count, Absolute: 64 10*3/uL (ref 19.0–186.0)
Retic Ct Pct: 1.7 % (ref 0.4–3.1)

## 2022-08-06 LAB — VITAMIN B12: Vitamin B-12: 340 pg/mL (ref 180–914)

## 2022-08-06 LAB — DIRECT ANTIGLOBULIN TEST (NOT AT ARMC)
DAT, IgG: NEGATIVE
DAT, complement: NEGATIVE

## 2022-08-06 LAB — FOLATE: Folate: 9.9 ng/mL (ref >5.9)

## 2022-08-06 LAB — FERRITIN: Ferritin: 103 ng/mL (ref 24–336)

## 2022-08-07 LAB — ANA COMPREHENSIVE PANEL
Anti JO-1: <0.2 AI (ref 0.0–0.9)
Centromere Ab Screen: <0.2 AI (ref 0.0–0.9)
Chromatin Ab SerPl-aCnc: <0.2 AI (ref 0.0–0.9)
ENA SM Ab Ser-aCnc: <0.2 AI (ref 0.0–0.9)
Ribonucleic Protein: 1.3 AI — ABNORMAL HIGH (ref 0.0–0.9)
SSA (Ro) (ENA) Antibody, IgG: <0.2 AI (ref 0.0–0.9)
SSB (La) (ENA) Antibody, IgG: <0.2 AI (ref 0.0–0.9)
Scleroderma (Scl-70) (ENA) Antibody, IgG: <0.2 AI (ref 0.0–0.9)
ds DNA Ab: 1 IU/mL (ref 0–9)

## 2022-08-07 LAB — KAPPA/LAMBDA LIGHT CHAINS
Kappa free light chain: 21.9 mg/L — ABNORMAL HIGH (ref 3.3–19.4)
Kappa, lambda light chain ratio: 1.94 — ABNORMAL HIGH (ref 0.26–1.65)
Lambda free light chains: 11.3 mg/L (ref 5.7–26.3)

## 2022-08-08 DIAGNOSIS — Z9289 Personal history of other medical treatment: Secondary | ICD-10-CM | POA: Insufficient documentation

## 2022-08-08 LAB — MULTIPLE MYELOMA PANEL, SERUM
Albumin SerPl Elph-Mcnc: 3.8 g/dL (ref 2.9–4.4)
Albumin/Glob SerPl: 1.4 (ref 0.7–1.7)
Alpha 1: 0.2 g/dL (ref 0.0–0.4)
Alpha2 Glob SerPl Elph-Mcnc: 0.5 g/dL (ref 0.4–1.0)
B-Globulin SerPl Elph-Mcnc: 0.9 g/dL (ref 0.7–1.3)
Gamma Glob SerPl Elph-Mcnc: 1.2 g/dL (ref 0.4–1.8)
Globulin, Total: 2.8 g/dL (ref 2.2–3.9)
IgA: 161 mg/dL (ref 90–386)
IgG (Immunoglobin G), Serum: 1166 mg/dL (ref 603–1613)
IgM (Immunoglobulin M), Srm: 79 mg/dL (ref 20–172)
Total Protein ELP: 6.6 g/dL (ref 6.0–8.5)

## 2022-08-08 LAB — HAPTOGLOBIN: Haptoglobin: 131 mg/dL (ref 29–370)

## 2022-08-08 LAB — RHEUMATOID FACTOR: Rheumatoid fact SerPl-aCnc: <10 IU/mL (ref <14.0)

## 2022-08-13 ENCOUNTER — Inpatient Hospital Stay (INDEPENDENT_AMBULATORY_CARE_PROVIDER_SITE_OTHER): Payer: BC Managed Care – PPO | Admitting: Oncology

## 2022-08-13 ENCOUNTER — Encounter: Payer: Self-pay | Admitting: Oncology

## 2022-08-13 VITALS — BP 145/78 | HR 85 | Temp 98.0°F | Resp 16 | Ht 71.8 in | Wt 267.4 lb

## 2022-08-13 DIAGNOSIS — Z9289 Personal history of other medical treatment: Secondary | ICD-10-CM

## 2022-08-13 DIAGNOSIS — Z7189 Other specified counseling: Secondary | ICD-10-CM

## 2022-08-13 DIAGNOSIS — D649 Anemia, unspecified: Secondary | ICD-10-CM

## 2022-08-13 DIAGNOSIS — D709 Neutropenia, unspecified: Secondary | ICD-10-CM | POA: Diagnosis not present

## 2022-08-13 NOTE — Progress Notes (Signed)
[facility] Initial Visit:  Patient Care Team: Angelina Sheriff, MD as PCP - General (Family Medicine)  CHIEF COMPLAINTS/PURPOSE OF CONSULTATION:  Oncology History   No history exists.    HISTORY OF PRESENTING ILLNESS: Jon Vasquez 56 y.o. male is here because of leukopenia and anemia  Medical history notable for arthritis, diverticulitis, GERD, atrial fibrillation   June 16, 2018: WBC 7.1 hemoglobin 15.1 platelet count 219; 80 segs 11 lymphs 8 monos 1 EO  December 23 2019:  Colonoscopy demonstrated a diminutive colonic polyp status post polypectomy and mild pancolonic diverticulosis and otherwise normal. Pathology showed benign mucosa.    June 20, 2021: WBC 5.4 hemoglobin 12.7 MCV 91 platelet count 198  December 20 2021:  EGD--Esophageal mucosal changes suspicious for long-segment Barrett's esophagus with LA grade D  esophagitis and wide open esophageal stricture. Biopsied.  5 cm hiatal hernia. Gastritis. Biopsied.    Normal examined duodenum.  Pathology 1. Surgical [P], gastric antrum and gastric body - mild gastritis.  FRAGMENTS OF GASTRIC MUCOSA WITH NO SIGNIFICANT MICROSCOPIC ABNORMALITIES. H. PYLORI, INTESTINAL METAPLASIA, ATROPHY AND DYSPLASIA ARE NOT IDENTIFIED. 2. Surgical [P], mid esophagus and distal esophagus.  FOCAL GOBLET CELL METAPLASIA AND NONSPECIFIC INFLAMMATION IN POLYPOID FRAGMENTS OF GASTRIC MUCOSA CONSISTENT WITH BARRETT'S ESOPHAGUS. NORMAL ESOPHAGEAL MUCOSA IS NOT IDENTIFIED  July 25 2022: WBC 3.3 hemoglobin 12.6 platelet count 246; 58 segs 35 lymphs.  ANC 1.9 ALC 1.1 Ferritin 163.  SPEP showed no paraprotein  August 05 2022:  [facility] Hematology Consult  Five months ago did 4 sessions of plasmapheresis for treatment of arthritis.  Last session was first week of September 2023.  The procedure did not help his arthritis.  He was hoping to avoid hip surgery.    For hip replacement on August 21 2022  Social:  Separated.   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  Tobacco none.  EtOH none  [place] Mother died 69 lung cancer Father died 86 pulmonary fibrosis and heart disease Brother alive 37 asthma  WBC 4.1 hemoglobin 11.8 MCV 93 platelet count 254; 49 segs 41 lymphs 8 monos 1 EO  ANC 2.0 ALC 1.7  Reticulocyte count 1.7% Coombs test negative.  Haptoglobin 131 SPEP with IEP showed no paraprotein.  Serum free kappa 21.9 lambda 11.3 with a kappa lambda 1.94 IgG 1166 IgA 161 IgM 79 ANA comprehensive panel notable for RNA protein 1.3 (upper limit normal 0.9) rheumatoid factor negative B12 340 folate 9.9 ferritin 103 Hepatitis ABC serologies negative CMP notable for calcium of 8.7 Reticulocyte index 1.43 Reticulocyte index < 2 indicates hypoproliferation   August 13 2022:  Reviewed results of labs with patient For total hip replacement on August 21 2022.  May proceed from hematology standpoint.    Review of Systems  Constitutional:  Negative for appetite change, chills, fatigue, fever and unexpected weight change.  HENT:   Negative for mouth sores, nosebleeds, sore throat, tinnitus, trouble swallowing and voice change.   Eyes:  Negative for eye problems and icterus.       Vision changes:  None  Respiratory:  Negative for chest tightness, cough, hemoptysis, shortness of breath and wheezing.        PND:  none Orthopnea:  none DOE:    Cardiovascular:  Negative for chest pain, leg swelling and palpitations.       PND:  none Orthopnea:  none  Gastrointestinal:  Negative for abdominal pain, blood in stool, constipation, diarrhea, nausea and vomiting.  Endocrine:  Negative for hot flashes.       Cold intolerance:  none Heat intolerance:  none  Genitourinary:  Negative for bladder incontinence, difficulty urinating, dysuria, frequency, hematuria and nocturia.   Musculoskeletal:  Positive for arthralgias and gait problem. Negative for back pain, myalgias, neck pain and neck stiffness.       Arthralgias in hip  right hip.    Skin:  Negative for itching, rash and wound.  Neurological:  Positive for gait problem. Negative for dizziness, extremity weakness, headaches, light-headedness and numbness.       Has plantar fascitis left foot  Hematological:  Negative for adenopathy. Does not bruise/bleed easily.  Psychiatric/Behavioral:  Negative for sleep disturbance and suicidal ideas. The patient is not nervous/anxious.     MEDICAL HISTORY: Past Medical History:  Diagnosis Date   Arthritis    Bronchitis    Diverticulitis    GERD (gastroesophageal reflux disease)    occasional    Pneumonia     SURGICAL HISTORY: Past Surgical History:  Procedure Laterality Date   APPENDECTOMY     ATRIAL FIBRILLATION ABLATION N/A 06/27/2021   Procedure: ATRIAL FIBRILLATION ABLATION;  Surgeon: Constance Haw, MD;  Location: [facility];  Service: Cardiovascular;  Laterality: N/A;   COLONOSCOPY     HERNIA REPAIR     Double Hernia   POLYPECTOMY      SOCIAL HISTORY: Social History   Socioeconomic History   Marital status: Legally Separated    Spouse name: Not on file   Number of children: 2   Years of education: 13   Highest education level: Some college, no degree  Occupational History   Not on file  Tobacco Use   Smoking status: Never   Smokeless tobacco: Never  Vaping Use   Vaping Use: Never used  Substance and Sexual Activity   Alcohol use: No   Drug use: No   Sexual activity: Not on file  Other Topics Concern   Not on file  Social History Narrative   Not on file   Social Determinants of Health   Financial Resource Strain: Not on file  Food Insecurity: Not on file  Transportation Needs: Not on file  Physical Activity: Not on file  Stress: Not on file  Social Connections: Not on file  Intimate Partner Violence: Not on file    FAMILY HISTORY Family History  Problem Relation Age of Onset   Lung cancer Mother    Colon polyps Mother    Heart disease Father    Pulmonary  fibrosis Father    Colon cancer Neg Hx    Esophageal cancer Neg Hx    Rectal cancer Neg Hx    Stomach cancer Neg Hx     ALLERGIES:  has No Known Allergies.  MEDICATIONS:  Current Outpatient Medications  Medication Sig Dispense Refill   amoxicillin (AMOXIL) 500 MG tablet Take 500 mg by mouth 3 (three) times daily.     cholecalciferol (VITAMIN D3) 25 MCG (1000 UNIT) tablet Take 1,000 Units by mouth daily.     diclofenac (VOLTAREN) 75 MG EC tablet Take 75 mg by mouth 2 (two) times daily.     ELDERBERRY PO Take 1 tablet by mouth daily.     HYDROCODONE-ACETAMINOPHEN PO Take 1 tablet by mouth as needed.     Multiple Vitamin (MULTIVITAMIN) tablet Take 1 tablet by mouth daily.     Omega 3 1000 MG CAPS Take 1,000 mg by mouth daily.     pantoprazole (PROTONIX) 40  MG tablet TAKE 1 TABLET BY MOUTH TWICE A DAY FOR 12 WEEKS THEN TAKE 1 TABLET DAILY INDEFINITELY 180 tablet 1   Probiotic Product (PROBIOTIC PO) Take 1 capsule by mouth daily.     vitamin B-12 (CYANOCOBALAMIN) 1000 MCG tablet Take 2,000 mcg by mouth daily.     zinc gluconate 50 MG tablet Take 50 mg by mouth in the morning and at bedtime.     No current facility-administered medications for this visit.    PHYSICAL EXAMINATION:  ECOG PERFORMANCE STATUS: 0 - Asymptomatic   There were no vitals filed for this visit.   There were no vitals filed for this visit.    Physical Exam Vitals and nursing note reviewed.  Constitutional:      Appearance: Normal appearance. He is not diaphoretic.     Comments: Here alone.    HENT:     Head: Normocephalic and atraumatic.     Right Ear: External ear normal.     Left Ear: External ear normal.     Nose: Nose normal.  Eyes:     General: No scleral icterus.    Conjunctiva/sclera: Conjunctivae normal.     Pupils: Pupils are equal, round, and reactive to light.  Cardiovascular:     Rate and Rhythm: Normal rate and regular rhythm.     Heart sounds:     No friction rub. No gallop.   Pulmonary:     Effort: Pulmonary effort is normal. No respiratory distress.     Breath sounds: Normal breath sounds. No stridor. No wheezing or rhonchi.  Abdominal:     Tenderness: There is no abdominal tenderness. There is no guarding or rebound.  Musculoskeletal:     Cervical back: Normal range of motion and neck supple. No rigidity or tenderness.     Comments: Ambulates with single point cane  Lymphadenopathy:     Head:     Right side of head: No submental, submandibular, tonsillar, preauricular, posterior auricular or occipital adenopathy.     Left side of head: No submental, submandibular, tonsillar, preauricular, posterior auricular or occipital adenopathy.     Cervical: No cervical adenopathy.     Right cervical: No superficial, deep or posterior cervical adenopathy.    Left cervical: No superficial, deep or posterior cervical adenopathy.     Upper Body:     Right upper body: No supraclavicular or axillary adenopathy.     Left upper body: No supraclavicular or axillary adenopathy.     Lower Body: No right inguinal adenopathy. No left inguinal adenopathy.  Skin:    Coloration: Skin is not jaundiced.  Neurological:     General: No focal deficit present.     Mental Status: He is alert and oriented to person, place, and time. Mental status is at baseline.  Psychiatric:        Mood and Affect: Mood normal.        Behavior: Behavior normal.        Thought Content: Thought content normal.        Judgment: Judgment normal.      LABORATORY DATA: I have personally reviewed the data as listed:  Appointment on 08/06/2022  Component Date Value Ref Range Status   Rhuematoid fact SerPl-aCnc 08/06/2022 <10.0  <14.0 IU/mL Final   Comment: (NOTE) Performed At: [facility] [place], [place] [id number] Rush Farmer MD FU:[id number]    ds DNA Ab 08/06/2022 1  0 - 9 IU/mL Final   Comment: (NOTE)  Negative      <5                                    Equivocal  5 - 9                                   Positive      >9    Ribonucleic Protein 08/06/2022 1.3 (H)  0.0 - 0.9 AI Final   ENA SM Ab Ser-aCnc 08/06/2022 <0.2  0.0 - 0.9 AI Final   Scleroderma (Scl-70) (ENA) Antibod* 08/06/2022 <0.2  0.0 - 0.9 AI Final   SSA (Ro) (ENA) Antibody, IgG 08/06/2022 <0.2  0.0 - 0.9 AI Final   SSB (La) (ENA) Antibody, IgG 08/06/2022 <0.2  0.0 - 0.9 AI Final   Chromatin Ab SerPl-aCnc 08/06/2022 <0.2  0.0 - 0.9 AI Final   Anti JO-1 08/06/2022 <0.2  0.0 - 0.9 AI Final   Centromere Ab Screen 08/06/2022 <0.2  0.0 - 0.9 AI Final   See below: 08/06/2022 Comment   Final   Comment: (NOTE) Autoantibody                       Disease Association ------------------------------------------------------------                        Condition                  Frequency ---------------------   ------------------------   --------- Antinuclear Antibody,    SLE, mixed connective Direct (ANA-D)           tissue diseases ---------------------   ------------------------   --------- dsDNA                    SLE                        40 - 60% ---------------------   ------------------------   --------- Chromatin                Drug induced SLE                90%                         SLE                        48 - 97% ---------------------   ------------------------   --------- SSA (Ro)                 SLE                        25 - 35%                         Sjogren's Syndrome         40 - 70%                         Neonatal Lupus                 100% ---------------------   ------------------------   --------- SSB (La)  SLE                                                       10%                         Sjogren's Syndrome              30% ---------------------   -----------------------    --------- Sm (anti-Smith)          SLE                        15 - 30% ---------------------   -----------------------    --------- RNP                       Mixed Connective Tissue                         Disease                         95% (U1 nRNP,                SLE                        30 - 50% anti-ribonucleoprotein)  Polymyositis and/or                         Dermatomyositis                 20% ---------------------   ------------------------   --------- Scl-70 (antiDNA          Scleroderma (diffuse)      20 - 35% topoisomerase)           Crest                           13% ---------------------   ------------------------   --------- Jo-1                     Polymyositis and/or                         Dermatomyositis            20 - 40% ---------------------   ------------------------   --------- Centromere B             Scleroderma -                           Crest                         variant                         80% Performed At: [place], [place] [id number] Rush Farmer MD WU:[id number]    Retic Ct Pct 08/06/2022 1.7  0.4 - 3.1 % Final   RBC. 08/06/2022 3.74 (L)  4.22 - 5.81 MIL/uL Final   Retic Count, Absolute 08/06/2022 64.0  19.0 - 186.0 K/uL Final   Immature Retic Fract 08/06/2022 14.7  2.3 - 15.9 % Final   Performed at [facility], [place] [address] [place], [place] [address]   IgG (Immunoglobin G), Serum 08/06/2022 1,166  603 - 1,613 mg/dL Final   IgA 08/06/2022 161  90 - 386 mg/dL Final   IgM (Immunoglobulin M), Srm 08/06/2022 79  20 - 172 mg/dL Final   Total Protein ELP 08/06/2022 6.6  6.0 - 8.5 g/dL Corrected   Albumin SerPl Elph-Mcnc 08/06/2022 3.8  2.9 - 4.4 g/dL Corrected   Alpha 1 08/06/2022 0.2  0.0 - 0.4 g/dL Corrected   Alpha2 Glob SerPl Elph-Mcnc 08/06/2022 0.5  0.4 - 1.0 g/dL Corrected   B-Globulin SerPl Elph-Mcnc 08/06/2022 0.9  0.7 - 1.3 g/dL Corrected   Gamma Glob SerPl Elph-Mcnc 08/06/2022 1.2  0.4 - 1.8 g/dL Corrected   M Protein SerPl Elph-Mcnc 08/06/2022 Not Observed  Not Observed g/dL Corrected   Globulin, Total 08/06/2022  2.8  2.2 - 3.9 g/dL Corrected   Albumin/Glob SerPl 08/06/2022 1.4  0.7 - 1.7 Corrected   IFE 1 08/06/2022 Comment   Corrected   Comment: (NOTE) The immunofixation pattern appears unremarkable. Evidence of monoclonal protein is not apparent.    Please Note 08/06/2022 Comment   Corrected   Comment: (NOTE) Protein electrophoresis scan will follow via computer, mail, or courier delivery. Performed At: [facility] [place], [place] [id number] Rush Farmer MD AS:[id number]    Vitamin B-12 08/06/2022 340  180 - 914 pg/mL Final   Comment: (NOTE) This assay is not validated for testing neonatal or myeloproliferative syndrome specimens for Vitamin B12 levels. Performed at [facility], [place] [address] [place], [place] [address]    Kappa free light chain 08/06/2022 21.9 (H)  3.3 - 19.4 mg/L Final   Lambda free light chains 08/06/2022 11.3  5.7 - 26.3 mg/L Final   Kappa, lambda light chain ratio 08/06/2022 1.94 (H)  0.26 - 1.65 Final   Comment: (NOTE) Performed At: [facility] [place], [place] [id number] Rush Farmer MD GD:[id number]    Haptoglobin 08/06/2022 131  29 - 370 mg/dL Final   Comment: (NOTE) Performed At: [facility] [place], [place] [id number] Rush Farmer MD QJ:[id number]    Folate 08/06/2022 9.9  >5.9 ng/mL Final   Performed at [facility], [place] [address] [place], [place] [address]   Ferritin 08/06/2022 103  24 - 336 ng/mL Final   Performed at [facility], [place] [address], [place], [place] [address]   DAT, complement 08/06/2022 NEG   Final   DAT, IgG 08/06/2022    Final                   Value:NEG Performed at [facility], [place] [address], [place], [place] [address]   Office Visit on 08/05/2022  Component Date Value Ref Range Status   WBC 08/06/2022 4.1  4.0 - 10.5 K/uL Final   RBC 08/06/2022 3.80 (L)  4.22 - 5.81  MIL/uL Final   Hemoglobin 08/06/2022 11.8 (L)  13.0 - 17.0 g/dL Final   HCT 08/06/2022 35.3 (L)  39.0 - 52.0 % Final   MCV 08/06/2022 92.9  80.0 - 100.0 fL Final   MCH 08/06/2022 31.1  26.0 - 34.0 pg Final   MCHC 08/06/2022 33.4  30.0 - 36.0 g/dL Final   RDW 08/06/2022 12.3  11.5 - 15.5 %  Final   Platelets 08/06/2022 254  150 - 400 K/uL Final   nRBC 08/06/2022 0.0  0.0 - 0.2 % Final   Neutrophils Relative % 08/06/2022 49  % Final   Neutro Abs 08/06/2022 2.0  1.7 - 7.7 K/uL Final   Lymphocytes Relative 08/06/2022 41  % Final   Lymphs Abs 08/06/2022 1.7  0.7 - 4.0 K/uL Final   Monocytes Relative 08/06/2022 8  % Final   Monocytes Absolute 08/06/2022 0.3  0.1 - 1.0 K/uL Final   Eosinophils Relative 08/06/2022 1  % Final   Eosinophils Absolute 08/06/2022 0.0  0.0 - 0.5 K/uL Final   Basophils Relative 08/06/2022 0  % Final   Basophils Absolute 08/06/2022 0.0  0.0 - 0.1 K/uL Final   Immature Granulocytes 08/06/2022 1  % Final   Abs Immature Granulocytes 08/06/2022 0.02  0.00 - 0.07 K/uL Final   Performed at [facility], [place] [address] [place], [place] [address]   Sodium 08/06/2022 139  135 - 145 mmol/L Final   Potassium 08/06/2022 3.8  3.5 - 5.1 mmol/L Final   Chloride 08/06/2022 107  98 - 111 mmol/L Final   CO2 08/06/2022 25  22 - 32 mmol/L Final   Glucose, Bld 08/06/2022 95  70 - 99 mg/dL Final   Glucose reference range applies only to samples taken after fasting for at least 8 hours.   BUN 08/06/2022 19  6 - 20 mg/dL Final   Creatinine, Ser 08/06/2022 1.01  0.61 - 1.24 mg/dL Final   Calcium 08/06/2022 8.7 (L)  8.9 - 10.3 mg/dL Final   Total Protein 08/06/2022 7.2  6.5 - 8.1 g/dL Final   Albumin 08/06/2022 4.2  3.5 - 5.0 g/dL Final   AST 08/06/2022 21  15 - 41 U/L Final   ALT 08/06/2022 36  0 - 44 U/L Final   Alkaline Phosphatase 08/06/2022 94  38 - 126 U/L Final   Total Bilirubin 08/06/2022 0.4  0.3 - 1.2 mg/dL Final   GFR, Estimated 08/06/2022 >60  >60 mL/min  Final   Comment: (NOTE) Calculated using the CKD-EPI Creatinine Equation (2021)    Anion gap 08/06/2022 7  5 - 15 Final   Performed at [facility], [place] [address], [place], [place] [address]   Hepatitis B Surface Ag 08/06/2022 NON REACTIVE  NON REACTIVE Final   HCV Ab 08/06/2022 NON REACTIVE  NON REACTIVE Final   Comment: (NOTE) Nonreactive HCV antibody screen is consistent with no HCV infections,  unless recent infection is suspected or other evidence exists to indicate HCV infection.     Hep A IgM 08/06/2022 NON REACTIVE  NON REACTIVE Final   Hep B C IgM 08/06/2022 NON REACTIVE  NON REACTIVE Final   Performed at [facility], [place] [address], [place], [place] [address]    RADIOGRAPHIC STUDIES: I have personally reviewed the radiological images as listed and agree with the findings in the report  No results found.  ASSESSMENT/PLAN 57 y.o. male with medical history notable for arthritis, diverticulitis, GERD, atrial fibrillation.  Patient is being evaluated for leukopenia and anemia  Anemia:  Patient noted to have Hgb 12.6 in November 2023 which was new.   April 2021 Colonoscopy demonstrated diminutive polyp and diverticulosis March 2023:  EGD log sebment barrets and gastritis November 2023:  Ferritin 163 No evidence of nutritional deficiency, myeloma or hemolysis.  ANA comprehensive panel notable for slightly elevated RNP but in absence of other positive markers and  rheumatologic sx this can be followed clinically.   Will check flow for PNH, pyruvate kinase, Zinc, copper, CBC with diff, retic count.   Leukopenia:  New.  Resolving.    Most likely explanation for new onset leukopenia and anemia is recent history of leukophresis  Orthopedic surgery:  May proceed from hematology standpoint.       Cancer Staging  No matching staging information was found for the patient.   No problem-specific Assessment & Plan notes found for this encounter.   No  orders of the defined types were placed in this encounter.   All questions were answered. The patient knows to call the clinic with any problems, questions or concerns.  This note was electronically signed.    Barbee Cough, MD  08/13/2022 3:40 PM

## 2022-08-14 ENCOUNTER — Inpatient Hospital Stay: Payer: BC Managed Care – PPO

## 2022-08-14 DIAGNOSIS — Z79899 Other long term (current) drug therapy: Secondary | ICD-10-CM | POA: Diagnosis not present

## 2022-08-14 DIAGNOSIS — K219 Gastro-esophageal reflux disease without esophagitis: Secondary | ICD-10-CM | POA: Diagnosis not present

## 2022-08-14 DIAGNOSIS — D72819 Decreased white blood cell count, unspecified: Secondary | ICD-10-CM | POA: Diagnosis not present

## 2022-08-14 DIAGNOSIS — I4891 Unspecified atrial fibrillation: Secondary | ICD-10-CM | POA: Diagnosis not present

## 2022-08-14 DIAGNOSIS — D649 Anemia, unspecified: Secondary | ICD-10-CM | POA: Diagnosis not present

## 2022-08-14 DIAGNOSIS — D709 Neutropenia, unspecified: Secondary | ICD-10-CM

## 2022-08-14 LAB — RETICULOCYTES
Immature Retic Fract: 13.2 % (ref 2.3–15.9)
RBC.: 3.84 MIL/uL — ABNORMAL LOW (ref 4.22–5.81)
Retic Count, Absolute: 70.3 10*3/uL (ref 19.0–186.0)
Retic Ct Pct: 1.8 % (ref 0.4–3.1)

## 2022-08-14 LAB — CBC WITH DIFFERENTIAL (CANCER CENTER ONLY)
Abs Immature Granulocytes: 0 10*3/uL (ref 0.00–0.07)
Basophils Absolute: 0 10*3/uL (ref 0.0–0.1)
Basophils Relative: 0 %
Eosinophils Absolute: 0 10*3/uL (ref 0.0–0.5)
Eosinophils Relative: 1 %
HCT: 37.1 % — ABNORMAL LOW (ref 39.0–52.0)
Hemoglobin: 12.5 g/dL — ABNORMAL LOW (ref 13.0–17.0)
Immature Granulocytes: 0 %
Lymphocytes Relative: 36 %
Lymphs Abs: 1.6 10*3/uL (ref 0.7–4.0)
MCH: 32.1 pg (ref 26.0–34.0)
MCHC: 33.7 g/dL (ref 30.0–36.0)
MCV: 95.1 fL (ref 80.0–100.0)
Monocytes Absolute: 0.3 10*3/uL (ref 0.1–1.0)
Monocytes Relative: 7 %
Neutro Abs: 2.6 10*3/uL (ref 1.7–7.7)
Neutrophils Relative %: 56 %
Platelet Count: 246 10*3/uL (ref 150–400)
RBC: 3.9 MIL/uL — ABNORMAL LOW (ref 4.22–5.81)
RDW: 12.6 % (ref 11.5–15.5)
WBC Count: 4.6 10*3/uL (ref 4.0–10.5)
nRBC: 0 % (ref 0.0–0.2)

## 2022-08-16 LAB — COPPER, SERUM: Copper: 98 ug/dL (ref 69–132)

## 2022-08-16 LAB — ZINC: Zinc: 67 ug/dL (ref 44–115)

## 2022-08-19 LAB — PYRUVATE KINASE: Pyruvate Kinase (PK): 7.1 U/g{Hb} (ref 4.6–11.2)

## 2022-08-19 LAB — PNH PROFILE (-HIGH SENSITIVITY)

## 2022-08-23 DIAGNOSIS — M1611 Unilateral primary osteoarthritis, right hip: Secondary | ICD-10-CM | POA: Diagnosis not present

## 2022-08-24 LAB — ZINC: Zinc: 75 ug/dL (ref 44–115)

## 2022-08-24 LAB — COPPER, SERUM: Copper: 96 ug/dL (ref 69–132)

## 2022-08-26 ENCOUNTER — Encounter: Payer: Self-pay | Admitting: Cardiology

## 2022-08-26 ENCOUNTER — Ambulatory Visit: Payer: BC Managed Care – PPO | Attending: Cardiology | Admitting: Cardiology

## 2022-08-26 VITALS — BP 136/78 | HR 81 | Ht 72.0 in | Wt 265.5 lb

## 2022-08-26 DIAGNOSIS — I480 Paroxysmal atrial fibrillation: Secondary | ICD-10-CM

## 2022-08-26 DIAGNOSIS — Z7189 Other specified counseling: Secondary | ICD-10-CM | POA: Insufficient documentation

## 2022-08-26 NOTE — Patient Instructions (Signed)
Medication Instructions:  Your physician recommends that you continue on your current medications as directed. Please refer to the Current Medication list given to you today.  *If you need a refill on your cardiac medications before your next appointment, please call your pharmacy*   Lab Work: None ordered   Testing/Procedures: None ordered   Follow-Up: At [facility], you and your health needs are our priority.  As part of our continuing mission to provide you with exceptional heart care, we have created designated Provider Care Teams.  These Care Teams include your primary Cardiologist (physician) and Advanced Practice Providers (APPs -  Physician Assistants and Nurse Practitioners) who all work together to provide you with the care you need, when you need it.  Your next appointment:   1 year(s)  The format for your next appointment:   In Person  Provider:   Will Camnitz, MD    Thank you for choosing [facility]!!   Tereza Gilham, RN [phone]  Other Instructions   Important Information About Sugar           

## 2022-08-26 NOTE — Progress Notes (Signed)
Electrophysiology Office Note   Date:  08/26/2022   ID:  Jon, Vasquez [birth year], MRN [id number]  PCP:  Jon Sheriff, MD  Cardiologist:  Jon Vasquez Primary Electrophysiologist:  Jon Maultsby Meredith Leeds, MD    Chief Complaint: AF   History of Present Illness: Jon Vasquez is a 56 y.o. male who is being seen today for the evaluation of AF at the request of Jon Sheriff, MD. Presenting today for electrophysiology evaluation.  He has a history significant for atrial fibrillation.  He was seen 02/13/2021 with palpitations and atrial fibrillation.  Cardiac monitor showed a 2% burden.  He is post ablation 06/27/2021.  Today, denies symptoms of palpitations, chest pain, shortness of breath, orthopnea, PND, lower extremity edema, claudication, dizziness, presyncope, syncope, bleeding, or neurologic sequela. The patient is tolerating medications without difficulties.  He has not had any further episodes of atrial fibrillation.  He is able to all of his daily activities.  He does have plans for right hip replacement this week.  Aside from that, no complaints.   Past Medical History:  Diagnosis Date   Arthritis    Bronchitis    Diverticulitis    GERD (gastroesophageal reflux disease)    occasional    Pneumonia    Past Surgical History:  Procedure Laterality Date   APPENDECTOMY     ATRIAL FIBRILLATION ABLATION N/A 06/27/2021   Procedure: ATRIAL FIBRILLATION ABLATION;  Surgeon: Jon Haw, MD;  Location: [facility];  Service: Cardiovascular;  Laterality: N/A;   COLONOSCOPY     HERNIA REPAIR     Double Hernia   POLYPECTOMY       Current Outpatient Medications  Medication Sig Dispense Refill   acetaminophen (TYLENOL) 500 MG tablet Take 500 mg by mouth every 6 (six) hours as needed (pain).     cholecalciferol (VITAMIN D3) 25 MCG (1000 UNIT) tablet Take 1,000 Units by mouth daily.     diclofenac (VOLTAREN) 75 MG EC tablet Take 75 mg by mouth 2 (two)  times daily.     ELDERBERRY PO Take 1 tablet by mouth daily.     HYDROCODONE-ACETAMINOPHEN PO Take 1 tablet by mouth as needed.     Multiple Vitamin (MULTIVITAMIN) tablet Take 1 tablet by mouth daily.     Omega 3 1000 MG CAPS Take 1,000 mg by mouth daily.     pantoprazole (PROTONIX) 40 MG tablet TAKE 1 TABLET BY MOUTH TWICE A DAY FOR 12 WEEKS THEN TAKE 1 TABLET DAILY INDEFINITELY 180 tablet 1   Probiotic Product (PROBIOTIC PO) Take 1 capsule by mouth daily.     vitamin B-12 (CYANOCOBALAMIN) 1000 MCG tablet Take 2,000 mcg by mouth daily.     zinc gluconate 50 MG tablet Take 50 mg by mouth in the morning and at bedtime.     No current facility-administered medications for this visit.    Allergies:   Patient has no known allergies.   Social History:  The patient  reports that he has never smoked. He has never used smokeless tobacco. He reports that he does not drink alcohol and does not use drugs.   Family History:  The patient's family history includes Colon polyps in his mother; Heart disease in his father; Lung cancer in his mother; Pulmonary fibrosis in his father.   ROS:  Please see the history of present illness.   Otherwise, review of systems is positive for none.   All other systems are reviewed and negative.  PHYSICAL EXAM: VS:  BP 136/78   Pulse 81   Ht 6' (1.829 m)   Wt 265 lb 8 oz (120.4 kg)   SpO2 93%   BMI 36.01 kg/m  , BMI Body mass index is 36.01 kg/m. GEN: Well nourished, well developed, in no acute distress  HEENT: normal  Neck: no JVD, carotid bruits, or masses Cardiac: RRR; no murmurs, rubs, or gallops,no edema  Respiratory:  clear to auscultation bilaterally, normal work of breathing GI: soft, nontender, nondistended, + BS MS: no deformity or atrophy  Skin: warm and dry Neuro:  Strength and sensation are intact Psych: euthymic mood, full affect  EKG:  EKG is ordered today. Personal review of the ekg ordered shows sinus rhythm   Recent Labs: 08/06/2022:  ALT 36; BUN 19; Creatinine, Ser 1.01; Potassium 3.8; Sodium 139 08/14/2022: Hemoglobin 12.5; Platelet Count 246    Lipid Panel  No results found for: "CHOL", "TRIG", "HDL", "CHOLHDL", "VLDL", "LDLCALC", "LDLDIRECT"   Wt Readings from Last 3 Encounters:  08/26/22 265 lb 8 oz (120.4 kg)  08/13/22 267 lb 6.4 oz (121.3 kg)  08/05/22 268 lb 4.8 oz (121.7 kg)      Other studies Reviewed: Additional studies/ records that were reviewed today include: Myoview 02/22/2021 Review of the above records today demonstrates:  The left ventricular ejection fraction is normal (55-65%). Nuclear stress EF: 64%. There was no ST segment deviation noted during stress. No T wave inversion was noted during stress. The study is normal. This is a low risk study.  Cardiac monitor 02/27/2021 personally reviewed Atrial fibrillation was present with an A. fib burden of 2%.  There were 61 episodes 3 greater than 6 minutes the longest 47 minutes average heart rate of 160 bpm.  ASSESSMENT AND PLAN:  1.  Paroxysmal atrial fibrillation: Cardiac monitor with a 2% burden.  Status post ablation 09/27/2020.  CHA2DS2-VASc of 0 and thus not anticoagulated.  No further episodes of atrial fibrillation.  Continue with current management.  2.  Obesity: Lifestyle modification encouraged Body mass index is 36.01 kg/m.    Current medicines are reviewed at length with the patient today.   The patient does not have concerns regarding his medicines.  The following changes were made today: None  Labs/ tests ordered today include:  Orders Placed This Encounter  Procedures   EKG 12-Lead     Disposition:   FU with Jon Vasquez 12 months  Signed, Tyresa Prindiville Meredith Leeds, MD  08/26/2022 10:28 AM     [address] [place] [address] [phone] (office) [phone] (fax)

## 2022-08-29 DIAGNOSIS — M1611 Unilateral primary osteoarthritis, right hip: Secondary | ICD-10-CM | POA: Diagnosis not present

## 2022-08-29 DIAGNOSIS — M1612 Unilateral primary osteoarthritis, left hip: Secondary | ICD-10-CM | POA: Diagnosis not present

## 2022-12-11 DIAGNOSIS — M25552 Pain in left hip: Secondary | ICD-10-CM | POA: Diagnosis not present

## 2022-12-11 DIAGNOSIS — M545 Low back pain, unspecified: Secondary | ICD-10-CM | POA: Diagnosis not present

## 2022-12-16 ENCOUNTER — Other Ambulatory Visit: Payer: Self-pay | Admitting: Gastroenterology

## 2022-12-16 DIAGNOSIS — K2970 Gastritis, unspecified, without bleeding: Secondary | ICD-10-CM

## 2022-12-16 DIAGNOSIS — K219 Gastro-esophageal reflux disease without esophagitis: Secondary | ICD-10-CM

## 2022-12-16 DIAGNOSIS — K449 Diaphragmatic hernia without obstruction or gangrene: Secondary | ICD-10-CM

## 2022-12-18 DIAGNOSIS — M9905 Segmental and somatic dysfunction of pelvic region: Secondary | ICD-10-CM | POA: Diagnosis not present

## 2022-12-18 DIAGNOSIS — M5387 Other specified dorsopathies, lumbosacral region: Secondary | ICD-10-CM | POA: Diagnosis not present

## 2022-12-18 DIAGNOSIS — M5441 Lumbago with sciatica, right side: Secondary | ICD-10-CM | POA: Diagnosis not present

## 2022-12-18 DIAGNOSIS — M461 Sacroiliitis, not elsewhere classified: Secondary | ICD-10-CM | POA: Diagnosis not present

## 2022-12-24 ENCOUNTER — Encounter: Payer: Self-pay | Admitting: Gastroenterology

## 2022-12-25 DIAGNOSIS — M9905 Segmental and somatic dysfunction of pelvic region: Secondary | ICD-10-CM | POA: Diagnosis not present

## 2022-12-25 DIAGNOSIS — M5387 Other specified dorsopathies, lumbosacral region: Secondary | ICD-10-CM | POA: Diagnosis not present

## 2022-12-25 DIAGNOSIS — M5441 Lumbago with sciatica, right side: Secondary | ICD-10-CM | POA: Diagnosis not present

## 2022-12-25 DIAGNOSIS — M461 Sacroiliitis, not elsewhere classified: Secondary | ICD-10-CM | POA: Diagnosis not present

## 2022-12-27 DIAGNOSIS — M461 Sacroiliitis, not elsewhere classified: Secondary | ICD-10-CM | POA: Diagnosis not present

## 2022-12-27 DIAGNOSIS — M9905 Segmental and somatic dysfunction of pelvic region: Secondary | ICD-10-CM | POA: Diagnosis not present

## 2022-12-27 DIAGNOSIS — M5441 Lumbago with sciatica, right side: Secondary | ICD-10-CM | POA: Diagnosis not present

## 2022-12-27 DIAGNOSIS — M5387 Other specified dorsopathies, lumbosacral region: Secondary | ICD-10-CM | POA: Diagnosis not present

## 2022-12-31 DIAGNOSIS — M5441 Lumbago with sciatica, right side: Secondary | ICD-10-CM | POA: Diagnosis not present

## 2022-12-31 DIAGNOSIS — M5387 Other specified dorsopathies, lumbosacral region: Secondary | ICD-10-CM | POA: Diagnosis not present

## 2022-12-31 DIAGNOSIS — M9905 Segmental and somatic dysfunction of pelvic region: Secondary | ICD-10-CM | POA: Diagnosis not present

## 2022-12-31 DIAGNOSIS — M461 Sacroiliitis, not elsewhere classified: Secondary | ICD-10-CM | POA: Diagnosis not present

## 2023-01-02 DIAGNOSIS — M5387 Other specified dorsopathies, lumbosacral region: Secondary | ICD-10-CM | POA: Diagnosis not present

## 2023-01-02 DIAGNOSIS — M9905 Segmental and somatic dysfunction of pelvic region: Secondary | ICD-10-CM | POA: Diagnosis not present

## 2023-01-02 DIAGNOSIS — M461 Sacroiliitis, not elsewhere classified: Secondary | ICD-10-CM | POA: Diagnosis not present

## 2023-01-02 DIAGNOSIS — M5441 Lumbago with sciatica, right side: Secondary | ICD-10-CM | POA: Diagnosis not present

## 2023-01-06 DIAGNOSIS — M5387 Other specified dorsopathies, lumbosacral region: Secondary | ICD-10-CM | POA: Diagnosis not present

## 2023-01-06 DIAGNOSIS — M461 Sacroiliitis, not elsewhere classified: Secondary | ICD-10-CM | POA: Diagnosis not present

## 2023-01-06 DIAGNOSIS — M5441 Lumbago with sciatica, right side: Secondary | ICD-10-CM | POA: Diagnosis not present

## 2023-01-06 DIAGNOSIS — M9905 Segmental and somatic dysfunction of pelvic region: Secondary | ICD-10-CM | POA: Diagnosis not present

## 2023-01-16 DIAGNOSIS — M461 Sacroiliitis, not elsewhere classified: Secondary | ICD-10-CM | POA: Diagnosis not present

## 2023-01-16 DIAGNOSIS — M9905 Segmental and somatic dysfunction of pelvic region: Secondary | ICD-10-CM | POA: Diagnosis not present

## 2023-01-16 DIAGNOSIS — M5441 Lumbago with sciatica, right side: Secondary | ICD-10-CM | POA: Diagnosis not present

## 2023-01-16 DIAGNOSIS — M5387 Other specified dorsopathies, lumbosacral region: Secondary | ICD-10-CM | POA: Diagnosis not present

## 2023-01-20 DIAGNOSIS — M9905 Segmental and somatic dysfunction of pelvic region: Secondary | ICD-10-CM | POA: Diagnosis not present

## 2023-01-20 DIAGNOSIS — M5387 Other specified dorsopathies, lumbosacral region: Secondary | ICD-10-CM | POA: Diagnosis not present

## 2023-01-20 DIAGNOSIS — M5441 Lumbago with sciatica, right side: Secondary | ICD-10-CM | POA: Diagnosis not present

## 2023-01-20 DIAGNOSIS — M461 Sacroiliitis, not elsewhere classified: Secondary | ICD-10-CM | POA: Diagnosis not present

## 2023-01-22 DIAGNOSIS — M545 Low back pain, unspecified: Secondary | ICD-10-CM | POA: Diagnosis not present

## 2023-01-31 DIAGNOSIS — M25552 Pain in left hip: Secondary | ICD-10-CM | POA: Diagnosis not present

## 2023-01-31 DIAGNOSIS — M6281 Muscle weakness (generalized): Secondary | ICD-10-CM | POA: Diagnosis not present

## 2023-02-03 DIAGNOSIS — M6281 Muscle weakness (generalized): Secondary | ICD-10-CM | POA: Diagnosis not present

## 2023-02-03 DIAGNOSIS — M25552 Pain in left hip: Secondary | ICD-10-CM | POA: Diagnosis not present

## 2023-02-07 DIAGNOSIS — M25552 Pain in left hip: Secondary | ICD-10-CM | POA: Diagnosis not present

## 2023-02-07 DIAGNOSIS — M6281 Muscle weakness (generalized): Secondary | ICD-10-CM | POA: Diagnosis not present

## 2023-02-13 DIAGNOSIS — M25552 Pain in left hip: Secondary | ICD-10-CM | POA: Diagnosis not present

## 2023-02-13 DIAGNOSIS — M6281 Muscle weakness (generalized): Secondary | ICD-10-CM | POA: Diagnosis not present

## 2023-03-05 DIAGNOSIS — M545 Low back pain, unspecified: Secondary | ICD-10-CM | POA: Diagnosis not present

## 2023-03-12 DIAGNOSIS — M545 Low back pain, unspecified: Secondary | ICD-10-CM | POA: Diagnosis not present

## 2023-03-20 DIAGNOSIS — M545 Low back pain, unspecified: Secondary | ICD-10-CM | POA: Diagnosis not present

## 2023-03-28 DIAGNOSIS — M5416 Radiculopathy, lumbar region: Secondary | ICD-10-CM | POA: Diagnosis not present

## 2023-04-21 DIAGNOSIS — M5451 Vertebrogenic low back pain: Secondary | ICD-10-CM | POA: Diagnosis not present

## 2023-04-21 DIAGNOSIS — M5416 Radiculopathy, lumbar region: Secondary | ICD-10-CM | POA: Diagnosis not present

## 2023-04-21 DIAGNOSIS — Z6832 Body mass index (BMI) 32.0-32.9, adult: Secondary | ICD-10-CM | POA: Diagnosis not present

## 2023-04-28 DIAGNOSIS — M5416 Radiculopathy, lumbar region: Secondary | ICD-10-CM | POA: Diagnosis not present

## 2023-05-08 DIAGNOSIS — M6281 Muscle weakness (generalized): Secondary | ICD-10-CM | POA: Diagnosis not present

## 2023-05-08 DIAGNOSIS — M5416 Radiculopathy, lumbar region: Secondary | ICD-10-CM | POA: Diagnosis not present

## 2023-05-09 DIAGNOSIS — M6281 Muscle weakness (generalized): Secondary | ICD-10-CM | POA: Diagnosis not present

## 2023-05-09 DIAGNOSIS — M5416 Radiculopathy, lumbar region: Secondary | ICD-10-CM | POA: Diagnosis not present

## 2023-05-15 DIAGNOSIS — M6281 Muscle weakness (generalized): Secondary | ICD-10-CM | POA: Diagnosis not present

## 2023-05-15 DIAGNOSIS — M5416 Radiculopathy, lumbar region: Secondary | ICD-10-CM | POA: Diagnosis not present

## 2023-05-16 DIAGNOSIS — M5416 Radiculopathy, lumbar region: Secondary | ICD-10-CM | POA: Diagnosis not present

## 2023-05-16 DIAGNOSIS — M6281 Muscle weakness (generalized): Secondary | ICD-10-CM | POA: Diagnosis not present

## 2023-05-19 DIAGNOSIS — M5416 Radiculopathy, lumbar region: Secondary | ICD-10-CM | POA: Diagnosis not present

## 2023-05-20 DIAGNOSIS — M6281 Muscle weakness (generalized): Secondary | ICD-10-CM | POA: Diagnosis not present

## 2023-05-20 DIAGNOSIS — M5416 Radiculopathy, lumbar region: Secondary | ICD-10-CM | POA: Diagnosis not present

## 2023-05-23 DIAGNOSIS — M6281 Muscle weakness (generalized): Secondary | ICD-10-CM | POA: Diagnosis not present

## 2023-05-23 DIAGNOSIS — M5416 Radiculopathy, lumbar region: Secondary | ICD-10-CM | POA: Diagnosis not present

## 2023-05-29 DIAGNOSIS — M6281 Muscle weakness (generalized): Secondary | ICD-10-CM | POA: Diagnosis not present

## 2023-05-29 DIAGNOSIS — M5416 Radiculopathy, lumbar region: Secondary | ICD-10-CM | POA: Diagnosis not present

## 2023-06-03 DIAGNOSIS — K219 Gastro-esophageal reflux disease without esophagitis: Secondary | ICD-10-CM | POA: Diagnosis not present

## 2023-06-03 DIAGNOSIS — R142 Eructation: Secondary | ICD-10-CM | POA: Diagnosis not present

## 2023-06-03 DIAGNOSIS — Z6834 Body mass index (BMI) 34.0-34.9, adult: Secondary | ICD-10-CM | POA: Diagnosis not present

## 2023-06-04 DIAGNOSIS — M5416 Radiculopathy, lumbar region: Secondary | ICD-10-CM | POA: Diagnosis not present

## 2023-06-04 DIAGNOSIS — M6281 Muscle weakness (generalized): Secondary | ICD-10-CM | POA: Diagnosis not present

## 2023-06-11 DIAGNOSIS — M5416 Radiculopathy, lumbar region: Secondary | ICD-10-CM | POA: Diagnosis not present

## 2023-06-11 DIAGNOSIS — M6281 Muscle weakness (generalized): Secondary | ICD-10-CM | POA: Diagnosis not present

## 2023-07-21 DIAGNOSIS — Z125 Encounter for screening for malignant neoplasm of prostate: Secondary | ICD-10-CM | POA: Diagnosis not present

## 2023-07-21 DIAGNOSIS — E7800 Pure hypercholesterolemia, unspecified: Secondary | ICD-10-CM | POA: Diagnosis not present

## 2023-07-23 DIAGNOSIS — Z113 Encounter for screening for infections with a predominantly sexual mode of transmission: Secondary | ICD-10-CM | POA: Diagnosis not present

## 2023-07-23 DIAGNOSIS — K2270 Barrett's esophagus without dysplasia: Secondary | ICD-10-CM | POA: Diagnosis not present

## 2023-07-23 DIAGNOSIS — Z0000 Encounter for general adult medical examination without abnormal findings: Secondary | ICD-10-CM | POA: Diagnosis not present

## 2023-07-23 DIAGNOSIS — R7303 Prediabetes: Secondary | ICD-10-CM | POA: Diagnosis not present

## 2023-07-23 DIAGNOSIS — Z23 Encounter for immunization: Secondary | ICD-10-CM | POA: Diagnosis not present

## 2023-07-23 DIAGNOSIS — Z131 Encounter for screening for diabetes mellitus: Secondary | ICD-10-CM | POA: Diagnosis not present

## 2023-08-05 DIAGNOSIS — K219 Gastro-esophageal reflux disease without esophagitis: Secondary | ICD-10-CM | POA: Diagnosis not present

## 2023-08-05 DIAGNOSIS — K2270 Barrett's esophagus without dysplasia: Secondary | ICD-10-CM | POA: Diagnosis not present

## 2023-08-06 DIAGNOSIS — J029 Acute pharyngitis, unspecified: Secondary | ICD-10-CM | POA: Diagnosis not present

## 2023-08-15 DIAGNOSIS — J189 Pneumonia, unspecified organism: Secondary | ICD-10-CM | POA: Diagnosis not present

## 2023-08-17 ENCOUNTER — Other Ambulatory Visit: Payer: Self-pay | Admitting: Gastroenterology

## 2023-08-17 DIAGNOSIS — K219 Gastro-esophageal reflux disease without esophagitis: Secondary | ICD-10-CM

## 2023-08-17 DIAGNOSIS — K449 Diaphragmatic hernia without obstruction or gangrene: Secondary | ICD-10-CM

## 2023-08-17 DIAGNOSIS — K2970 Gastritis, unspecified, without bleeding: Secondary | ICD-10-CM

## 2023-08-27 DIAGNOSIS — Z79899 Other long term (current) drug therapy: Secondary | ICD-10-CM | POA: Diagnosis not present

## 2023-08-27 DIAGNOSIS — K449 Diaphragmatic hernia without obstruction or gangrene: Secondary | ICD-10-CM | POA: Diagnosis not present

## 2023-08-27 DIAGNOSIS — K2270 Barrett's esophagus without dysplasia: Secondary | ICD-10-CM | POA: Diagnosis not present

## 2023-08-27 DIAGNOSIS — K219 Gastro-esophageal reflux disease without esophagitis: Secondary | ICD-10-CM | POA: Diagnosis not present

## 2023-10-17 ENCOUNTER — Other Ambulatory Visit: Payer: Self-pay

## 2023-10-17 ENCOUNTER — Other Ambulatory Visit: Payer: Self-pay | Admitting: Gastroenterology

## 2023-10-17 ENCOUNTER — Telehealth: Payer: Self-pay | Admitting: Gastroenterology

## 2023-10-17 DIAGNOSIS — K449 Diaphragmatic hernia without obstruction or gangrene: Secondary | ICD-10-CM

## 2023-10-17 DIAGNOSIS — K219 Gastro-esophageal reflux disease without esophagitis: Secondary | ICD-10-CM

## 2023-10-17 DIAGNOSIS — K2970 Gastritis, unspecified, without bleeding: Secondary | ICD-10-CM

## 2023-10-17 MED ORDER — PANTOPRAZOLE SODIUM 40 MG PO TBEC: 40.0000 mg | DELAYED_RELEASE_TABLET | Freq: Every day | ORAL | 0 refills | Status: AC

## 2023-10-17 NOTE — Telephone Encounter (Signed)
Good Afternoon Dr Chales Abrahams   Patient requesting to transfer care back to [place]. Previously had a ov on 07/2023 with Dr Jennye Boroughs.   Records are available in epic. Please review and advise on scheduling.   Thank you

## 2023-10-21 NOTE — Telephone Encounter (Signed)
OK to schedule in APP clinic please RG

## 2024-03-03 IMAGING — MR MR FOOT*L* W/O CM
5 series · 40 of 40 positions shown · non-contrast
Comparison: None Available.

CLINICAL DATA: Foot pain, chronic, entrapment syndrome possible
neuroma vs superficial peroneal neuritis, possible surgical
planning.

EXAM:
MRI OF THE LEFT FOOT WITHOUT CONTRAST
TECHNIQUE: Multiplanar, multisequence MR imaging of the left forefoot was
performed. No intravenous contrast was administered.

[Series 4: T1 · coronal · left · 3.0mm · 0.31mm/px · 11 of 48 slices shown (1 of 2)]
[im 1/48]
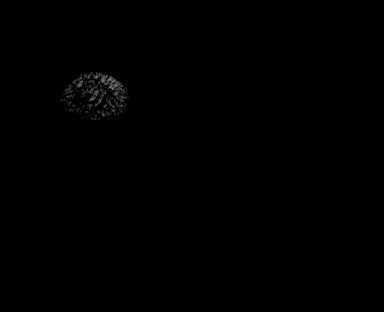
[im 5/48]
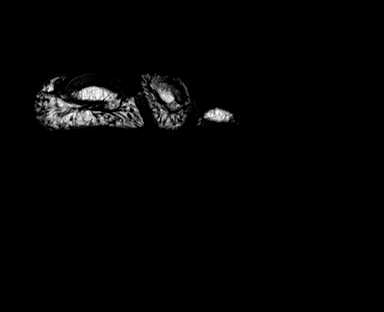
[im 10/48]
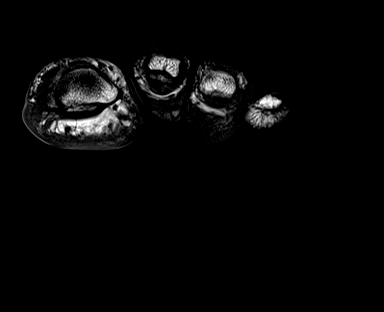
[im 15/48]
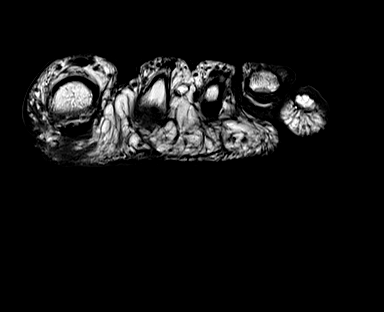
[im 19/48]
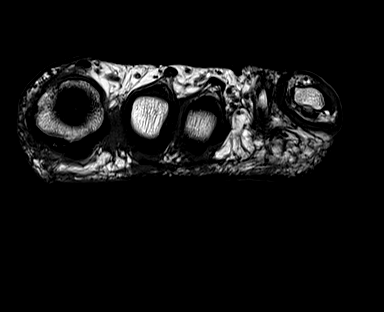
[im 24/48]
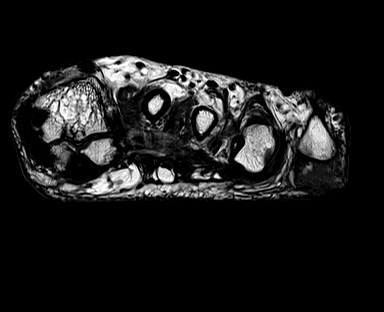
[im 29/48]
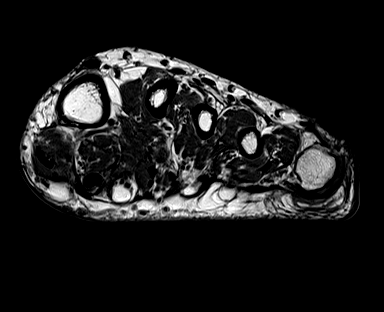
[im 33/48]
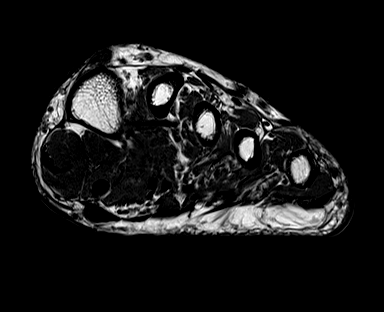
[im 38/48]
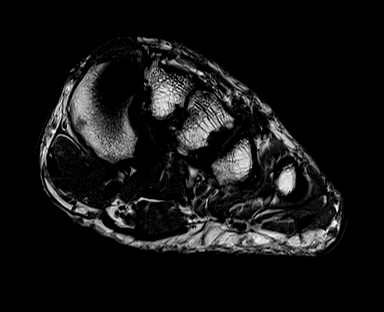
[im 43/48]
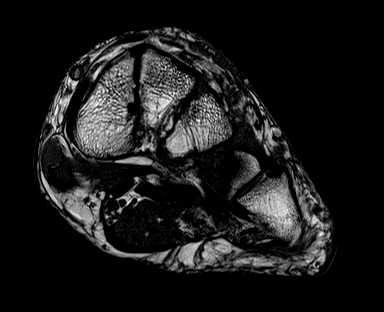
[im 48/48]
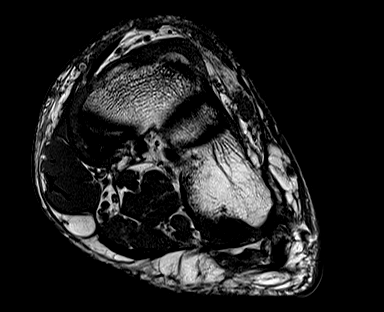

[Series 5: T2 fat-sat · coronal · left · 3.0mm · 0.31mm/px · 11 of 48 slices shown (1 of 2)]
[im 1/48]
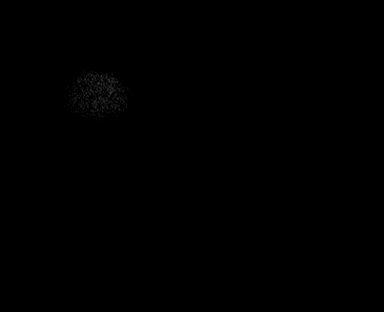
[im 5/48]
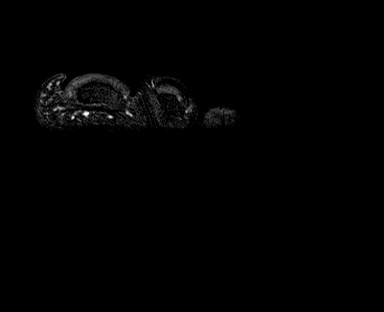
[im 10/48]
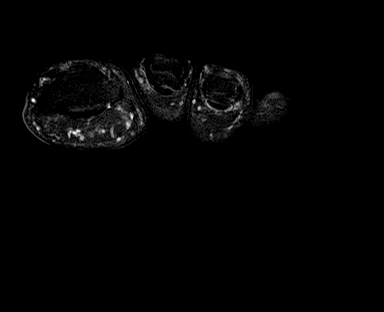
[im 15/48]
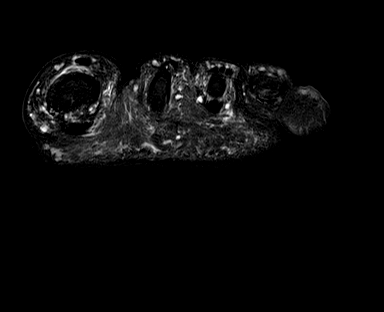
[im 19/48]
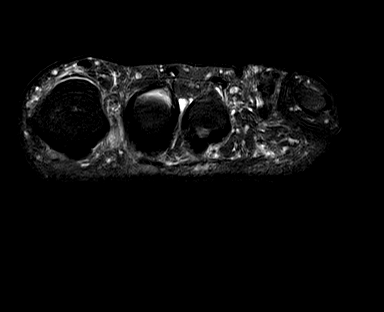
[im 24/48]
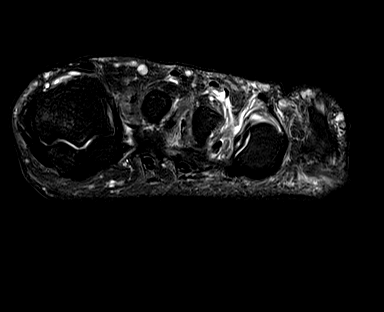
[im 29/48]
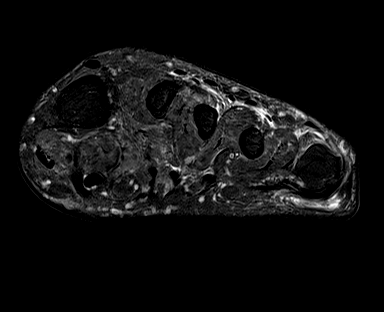
[im 33/48]
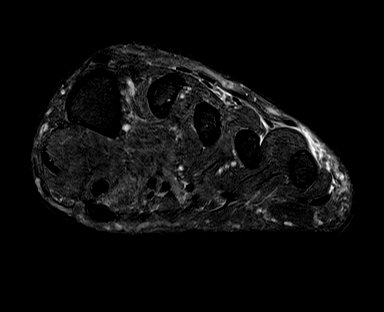
[im 38/48]
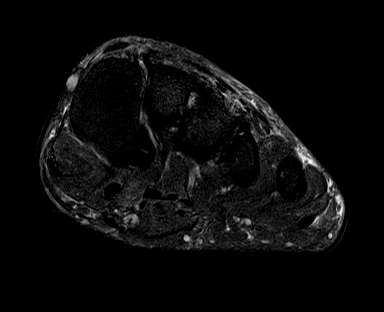
[im 43/48]
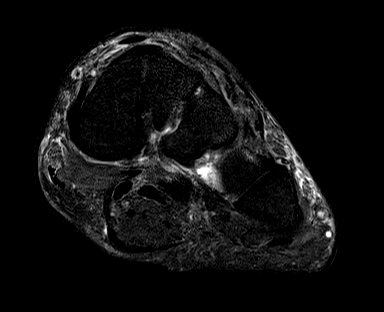
[im 48/48]
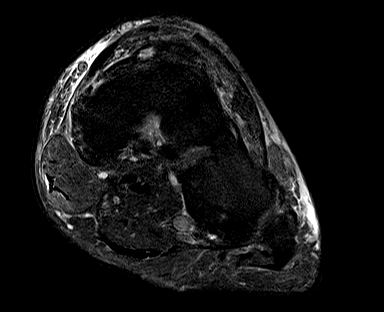

[Series 7: T1 · axial · left · 3.0mm · 0.49mm/px · z∈[-107,-26]mm · 5 of 23 slices shown (2 of 2)]
[im 1/23]
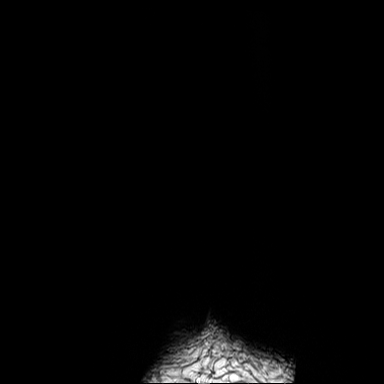
[im 6/23]
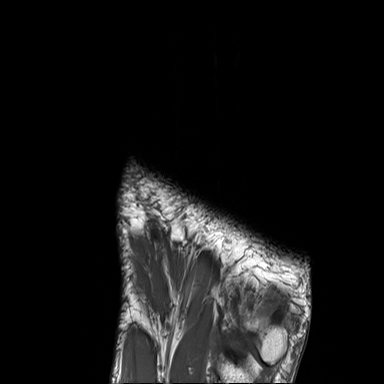
[im 12/23]
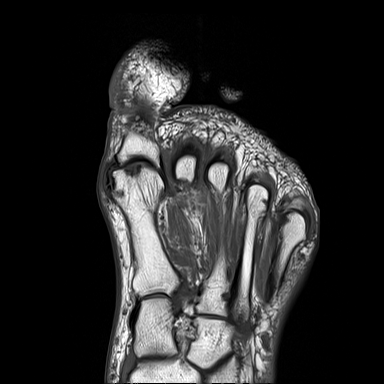
[im 17/23]
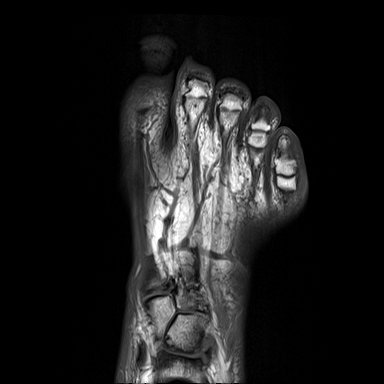
[im 23/23]
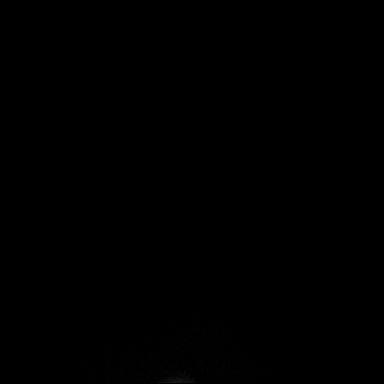

[Series 8: T2 fat-sat · axial · left · 3.0mm · 0.49mm/px · z∈[-107,-26]mm · 5 of 23 slices shown (2 of 2)]
[im 1/23]
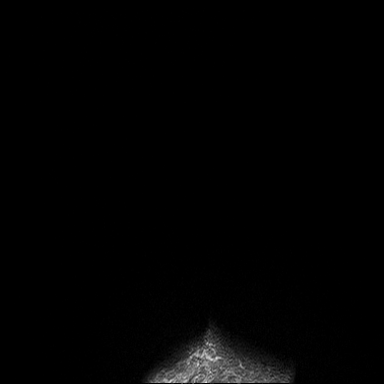
[im 6/23]
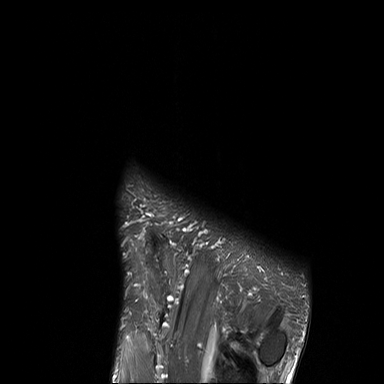
[im 12/23]
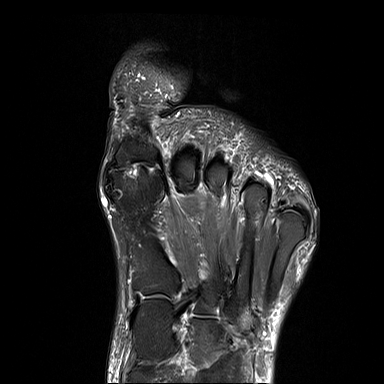
[im 17/23]
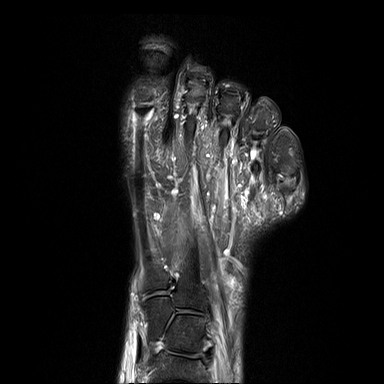
[im 23/23]
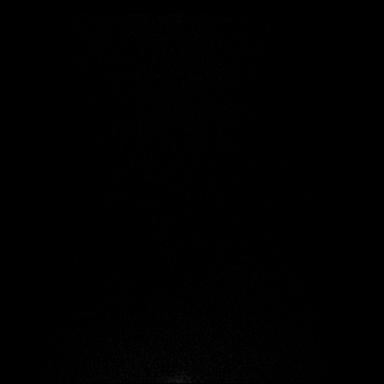

[Series 9: STIR · sagittal · left · 3.0mm · 0.78mm/px · 8 of 34 slices shown]
[im 1/34]
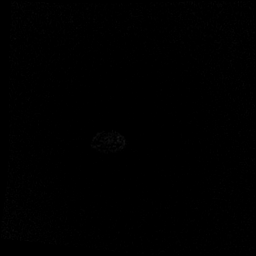
[im 5/34]
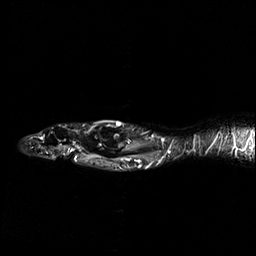
[im 10/34]
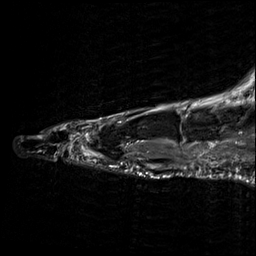
[im 15/34]
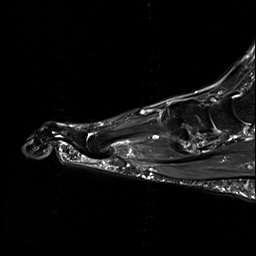
[im 19/34]
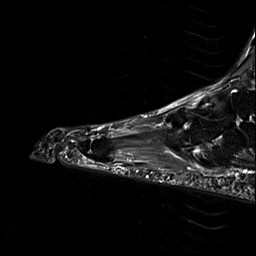
[im 24/34]
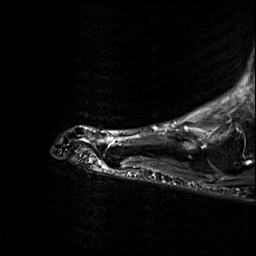
[im 29/34]
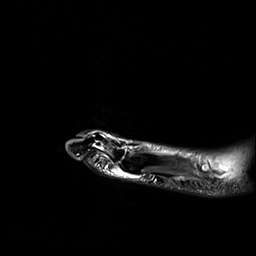
[im 34/34]
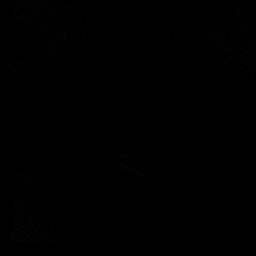

[40 of 40 positions shown; findings below may reference images not displayed]

FINDINGS: Bones/Joint/Cartilage

There is no evidence of acute fracture. There is moderate first MTP
and mild to moderate diffuse interphalangeal joint osteoarthritis
with associated periarticular marrow edema. No focal bone lesion.

Ligaments

Intact Lisfranc ligament. Intact MTP collateral ligaments. There is
no evidence of plantar plate tear.

Muscles and Tendons

Mild intramuscular edema. There is mild muscle atrophy in the
plantar lateral forefoot.

Soft tissues

There is intermetatarsal neuroma in the first webspace between the
first and second metatarsal heads measuring 1.5 x 0.8 cm (axial T1
image 20). There is an additional possible neuroma in the second
webspace at the base of the second and third toes measuring 1.0 x
0.5 cm (axial T1 image 17). There is soft tissue thickening and
ill-defined edema signal at the plantar aspect of the fifth
metatarsal head (axial T2 image 26).
IMPRESSION: Intermetatarsal neuroma in the first webspace measuring 1.5 x
cm. Additional possible neuroma in the second web space measuring
1.0 x 0.5 cm.

Soft tissue thickening and ill-defined edema signal at the plantar
aspect of fifth metatarsal head consistent with focal inflammatory
change, developing adventitial bursitis or callus.

Moderate first MTP and mild-to-moderate diffuse interphalangeal
joint osteoarthritis.

## 2024-03-04 DIAGNOSIS — M79641 Pain in right hand: Secondary | ICD-10-CM | POA: Diagnosis not present

## 2024-03-04 DIAGNOSIS — S61411A Laceration without foreign body of right hand, initial encounter: Secondary | ICD-10-CM | POA: Diagnosis not present

## 2024-06-28 ENCOUNTER — Ambulatory Visit: Attending: Cardiology | Admitting: Cardiology

## 2024-06-28 ENCOUNTER — Other Ambulatory Visit: Payer: Self-pay

## 2024-06-28 NOTE — Progress Notes (Unsigned)
 This encounter was created in error - please disregard.
